# Patient Record
Sex: Male | Born: 1970 | Race: White | Hispanic: No | Marital: Single | Smoking: Current some day smoker
Health system: Southern US, Community
[De-identification: ages and names within clinical notes are randomized; demographics above are authoritative.]

## PROBLEM LIST (undated history)

## (undated) DIAGNOSIS — N200 Calculus of kidney: Secondary | ICD-10-CM

## (undated) DIAGNOSIS — T7840XA Allergy, unspecified, initial encounter: Secondary | ICD-10-CM

## (undated) HISTORY — DX: Allergy, unspecified, initial encounter: T78.40XA

---

## 2012-04-01 ENCOUNTER — Ambulatory Visit (INDEPENDENT_AMBULATORY_CARE_PROVIDER_SITE_OTHER): Payer: BC Managed Care – PPO | Admitting: Family Medicine

## 2012-04-01 VITALS — BP 120/84 | HR 87 | Temp 97.2°F | Resp 16 | Ht 70.2 in | Wt 185.0 lb

## 2012-04-01 DIAGNOSIS — Z720 Tobacco use: Secondary | ICD-10-CM

## 2012-04-01 DIAGNOSIS — J069 Acute upper respiratory infection, unspecified: Secondary | ICD-10-CM

## 2012-04-01 DIAGNOSIS — J40 Bronchitis, not specified as acute or chronic: Secondary | ICD-10-CM

## 2012-04-01 DIAGNOSIS — F17200 Nicotine dependence, unspecified, uncomplicated: Secondary | ICD-10-CM

## 2012-04-01 MED ORDER — HYDROCODONE-HOMATROPINE 5-1.5 MG/5ML PO SYRP: 5.0000 mL | ORAL_SOLUTION | Freq: Three times a day (TID) | ORAL | Status: DC | PRN

## 2012-04-01 MED ORDER — AZITHROMYCIN 250 MG PO TABS: ORAL_TABLET | ORAL | Status: DC

## 2012-04-01 MED ORDER — BENZONATATE 100 MG PO CAPS: ORAL_CAPSULE | ORAL | Status: DC

## 2012-04-01 NOTE — Progress Notes (Signed)
Subjective: 41 year old man with a history of having had a cold which is gone into his chest. He has a bad cough. The cough awakens him at night. He has used several over-the-counter cough and cold preparations without relief. He works in Insurance account manager at [employer]. He does smoke. He is divorced but lives with his parents currently. He has a 72-year-old child and cares for her on the weekend. No exposure to other major illness.  Objective: Alert oriented healthy-appearing man. His throat is clear. Neck supple without nodes. TMs normal. Chest clear to auscultation. Heart regular without murmur scalp slight redness.  Assessment: Bronchitis URI History of tobacco abuse  Plan: Urged to seriously consider get getting off the cigarettes. Drink plenty of fluids Z-Pak Cough syrup for nighttime use, and Tessalon for daytime use  Return if worse or not improving

## 2012-04-01 NOTE — Patient Instructions (Addendum)
Encourage fluids, rest and avoidance of cigarettes Tessalon for daytime use Hycodan syrup at night. Return if not improving

## 2013-01-07 ENCOUNTER — Encounter (HOSPITAL_COMMUNITY): Payer: Self-pay | Admitting: Emergency Medicine

## 2013-01-07 ENCOUNTER — Emergency Department (HOSPITAL_COMMUNITY): Payer: BC Managed Care – PPO

## 2013-01-07 ENCOUNTER — Emergency Department (HOSPITAL_COMMUNITY)
Admission: EM | Admit: 2013-01-07 | Discharge: 2013-01-07 | Disposition: A | Discharge status: Home / Self Care | Payer: BC Managed Care – PPO | Attending: Emergency Medicine | Admitting: Emergency Medicine

## 2013-01-07 DIAGNOSIS — R110 Nausea: Secondary | ICD-10-CM | POA: Insufficient documentation

## 2013-01-07 DIAGNOSIS — Z87442 Personal history of urinary calculi: Secondary | ICD-10-CM | POA: Insufficient documentation

## 2013-01-07 DIAGNOSIS — F17200 Nicotine dependence, unspecified, uncomplicated: Secondary | ICD-10-CM | POA: Insufficient documentation

## 2013-01-07 DIAGNOSIS — N201 Calculus of ureter: Secondary | ICD-10-CM

## 2013-01-07 LAB — COMPREHENSIVE METABOLIC PANEL
ALT: 26 U/L (ref 0–53)
Alkaline Phosphatase: 101 U/L (ref 39–117)
CO2: 27 mEq/L (ref 19–32)
Chloride: 100 mEq/L (ref 96–112)
GFR calc Af Amer: >90 mL/min (ref >90)
GFR calc non Af Amer: >90 mL/min (ref >90)
Glucose, Bld: 142 mg/dL — ABNORMAL HIGH (ref 70–99)
Potassium: 4.4 mEq/L (ref 3.5–5.1)
Sodium: 135 mEq/L (ref 135–145)
Total Bilirubin: 0.3 mg/dL (ref 0.3–1.2)

## 2013-01-07 LAB — URINALYSIS, ROUTINE W REFLEX MICROSCOPIC
Bilirubin Urine: NEGATIVE
Glucose, UA: NEGATIVE mg/dL
Protein, ur: 30 mg/dL — AB
Urobilinogen, UA: 0.2 mg/dL (ref 0.0–1.0)

## 2013-01-07 LAB — CBC WITH DIFFERENTIAL/PLATELET
Basophils Absolute: 0 10*3/uL (ref 0.0–0.1)
Basophils Relative: 0 % (ref 0–1)
MCHC: 35.8 g/dL (ref 30.0–36.0)
Monocytes Absolute: 0.7 10*3/uL (ref 0.1–1.0)
Neutro Abs: 13.6 10*3/uL — ABNORMAL HIGH (ref 1.7–7.7)
Neutrophils Relative %: 84 % — ABNORMAL HIGH (ref 43–77)
Platelets: 331 10*3/uL (ref 150–400)
RDW: 12.5 % (ref 11.5–15.5)
WBC: 16.2 10*3/uL — ABNORMAL HIGH (ref 4.0–10.5)

## 2013-01-07 MED ORDER — HYDROMORPHONE HCL PF 1 MG/ML IJ SOLN
1.0000 mg | Freq: Once | INTRAMUSCULAR | Status: AC
  Administered 2013-01-07: 1 mg via INTRAVENOUS
  Filled 2013-01-07: qty 1

## 2013-01-07 MED ORDER — SODIUM CHLORIDE 0.9 % IV SOLN
INTRAVENOUS | Status: DC
  Administered 2013-01-07: 16:00:00 via INTRAVENOUS

## 2013-01-07 MED ORDER — KETOROLAC TROMETHAMINE 30 MG/ML IJ SOLN
30.0000 mg | Freq: Once | INTRAMUSCULAR | Status: AC
  Administered 2013-01-07: 30 mg via INTRAVENOUS
  Filled 2013-01-07: qty 1

## 2013-01-07 MED ORDER — OXYCODONE-ACETAMINOPHEN 5-325 MG PO TABS: 1.0000 | ORAL_TABLET | Freq: Four times a day (QID) | ORAL | Status: DC | PRN

## 2013-01-07 MED ORDER — ONDANSETRON HCL 4 MG PO TABS: 4.0000 mg | ORAL_TABLET | Freq: Four times a day (QID) | ORAL | Status: DC

## 2013-01-07 MED ORDER — ONDANSETRON HCL 4 MG/2ML IJ SOLN
4.0000 mg | Freq: Once | INTRAMUSCULAR | Status: AC
  Administered 2013-01-07: 4 mg via INTRAVENOUS
  Filled 2013-01-07: qty 2

## 2013-01-07 NOTE — ED Provider Notes (Signed)
CSN: [id number]     Arrival date & time 01/07/13  1406 History     First MD Initiated Contact with Patient 01/07/13 1505     Chief Complaint  Patient presents with  . Flank Pain   (Consider location/radiation/quality/duration/timing/severity/associated sxs/prior Treatment) HPI Timothy Cowan is a 42 y.o.male without any significant PMH presents to the ER with complaints of right flank pain that started at noon today. It is sharp intermittent and a 10 out of 10 pain. The patient says he is unable to sit still. He has a history of kidney stones and says this feels like the same. He is nauseous but has not vomited. He denies having diarrhea, fevers, weakness or any other symptoms at this time. He has not had any noticeable blood in his urine or dysuria. He shouldn't is in distress due to severe pain.  Past Medical History  Diagnosis Date  . Kidney stone    History reviewed. No pertinent past surgical history. History reviewed. No pertinent family history. History  Substance Use Topics  . Smoking status: Current Every Day Smoker -- 0.50 packs/day for 5 years    Types: Cigarettes  . Smokeless tobacco: Not on file  . Alcohol Use: No    Review of Systems ROS is negative unless otherwise stated in the HPI Allergies  Review of patient's allergies indicates no known allergies.  Home Medications   Current Outpatient Rx  Name  Route  Sig  Dispense  Refill  . ibuprofen (ADVIL,MOTRIN) 200 MG tablet   Oral   Take 800 mg by mouth every 6 (six) hours as needed for pain.         Marland Kitchen ondansetron (ZOFRAN) 4 MG tablet   Oral   Take 1 tablet (4 mg total) by mouth every 6 (six) hours.   12 tablet   0   . oxyCODONE-acetaminophen (PERCOCET/ROXICET) 5-325 MG per tablet   Oral   Take 1-2 tablets by mouth every 6 (six) hours as needed for pain.   20 tablet   0    BP 125/76  Pulse 95  Temp(Src) 97.7 F (36.5 C) (Oral)  Resp 17  SpO2 96% Physical Exam  Nursing note and vitals  reviewed. Constitutional: He appears well-developed and well-nourished. No distress.  HENT:  Head: Normocephalic and atraumatic.  Eyes: Pupils are equal, round, and reactive to light.  Neck: Normal range of motion. Neck supple.  Cardiovascular: Normal rate and regular rhythm.   Pulmonary/Chest: Effort normal.  Abdominal: Soft. There is tenderness. There is CVA tenderness (right flank). There is no rigidity, no guarding, no tenderness at McBurney's point and negative Murphy's sign.  Neurological: He is alert.  Skin: Skin is warm and dry.     ED Course   Procedures (including critical care time)  Labs Reviewed  CBC WITH DIFFERENTIAL - Abnormal; Notable for the following:    WBC 16.2 (*)    Neutrophils Relative % 84 (*)    Neutro Abs 13.6 (*)    Lymphocytes Relative 11 (*)    All other components within normal limits  COMPREHENSIVE METABOLIC PANEL - Abnormal; Notable for the following:    Glucose, Bld 142 (*)    All other components within normal limits  URINALYSIS, ROUTINE W REFLEX MICROSCOPIC - Abnormal; Notable for the following:    APPearance CLOUDY (*)    Specific Gravity, Urine 1.031 (*)    Hgb urine dipstick LARGE (*)    Protein, ur 30 (*)    All other components  within normal limits  LIPASE, BLOOD  URINE MICROSCOPIC-ADD ON   Ct Abdomen Pelvis Wo Contrast  01/07/2013   *RADIOLOGY REPORT*  Clinical Data: Right-sided flank pain since new today.  History of kidney stones.  CT ABDOMEN AND PELVIS WITHOUT CONTRAST  Technique:  Multidetector CT imaging of the abdomen and pelvis was performed following the standard protocol without intravenous contrast.  Comparison: None.  Findings: There is mild to moderate right hydronephrosis with dilation of the proximal ureter to the level of a 3 mm stone. There is perinephric stranding.  The ureter below the stone is decompressed with no additional stones.  There are nonobstructing intrarenal stones on the right with at least one tiny  nonobstructing stones suggested in the upper pole of the left kidney.  There is a low density area along the posterior margin of the upper pole of the right kidney that is likely a cyst measuring approximate 2 cm.  The kidneys are otherwise unremarkable.  The left ureter is normal course and caliber with no stones or signs of obstruction.  The bladder decompressed but otherwise unremarkable.  Clear lung bases.  The heart is normal in size.  Normal liver, spleen, gallbladder and pancreas.  No bile duct dilation.  No adrenal masses.  No adenopathy.  There are no abnormal fluid collections.  The bowels unremarkable.  A normal appendix is visualized.  No significant bony abnormality.  IMPRESSION: 3 mm stone in the proximal right ureter causes mild to moderate right hydronephrosis.  Bilateral nonobstructing intrarenal stones.  Probable right renal cyst.  No other abnormalities.   Original Report Authenticated By: Amie Portland, M.D.   1. Right ureteral stone     MDM  3 mm stone in the right ureter. No compounding UTI. Pts pain under control IV analgesic. Will give referral to urology and prescription for pain and nausea medication.  42 y.o.Timothy Cowan's evaluation in the Emergency Department is complete. It has been determined that no acute conditions requiring further emergency intervention are present at this time. The patient/guardian have been advised of the diagnosis and plan. We have discussed signs and symptoms that warrant return to the ED, such as changes or worsening in symptoms.  Vital signs are stable at discharge. Filed Vitals:   01/07/13 1720  BP: 125/76  Pulse: 95  Temp: 97.7 F (36.5 C)  Resp: 17    Patient/guardian has voiced understanding and agreed to follow-up with the PCP or specialist.   Dorthula Matas, PA-C 01/07/13 1808

## 2013-01-07 NOTE — ED Provider Notes (Signed)
Medical screening examination/treatment/procedure(s) were performed by non-physician practitioner and as supervising physician I was immediately available for consultation/collaboration.   Junius Argyle, MD 01/07/13 [phone]

## 2013-01-07 NOTE — ED Notes (Signed)
Pt states that he has been having rt sided flank pain since noon.  Hx of kidney stones.

## 2013-02-02 ENCOUNTER — Emergency Department (HOSPITAL_COMMUNITY)
Admission: EM | Admit: 2013-02-02 | Discharge: 2013-02-02 | Disposition: A | Discharge status: Home / Self Care | Payer: BC Managed Care – PPO | Attending: Emergency Medicine | Admitting: Emergency Medicine

## 2013-02-02 ENCOUNTER — Encounter (HOSPITAL_COMMUNITY): Payer: Self-pay

## 2013-02-02 DIAGNOSIS — R110 Nausea: Secondary | ICD-10-CM | POA: Insufficient documentation

## 2013-02-02 DIAGNOSIS — F17200 Nicotine dependence, unspecified, uncomplicated: Secondary | ICD-10-CM | POA: Insufficient documentation

## 2013-02-02 DIAGNOSIS — N200 Calculus of kidney: Secondary | ICD-10-CM

## 2013-02-02 LAB — COMPREHENSIVE METABOLIC PANEL
ALT: 42 U/L (ref 0–53)
AST: 28 U/L (ref 0–37)
Alkaline Phosphatase: 101 U/L (ref 39–117)
CO2: 25 mEq/L (ref 19–32)
Calcium: 10.6 mg/dL — ABNORMAL HIGH (ref 8.4–10.5)
GFR calc Af Amer: >90 mL/min (ref >90)
Glucose, Bld: 112 mg/dL — ABNORMAL HIGH (ref 70–99)
Potassium: 4.2 mEq/L (ref 3.5–5.1)
Sodium: 136 mEq/L (ref 135–145)
Total Protein: 8.1 g/dL (ref 6.0–8.3)

## 2013-02-02 LAB — URINALYSIS, ROUTINE W REFLEX MICROSCOPIC
Glucose, UA: NEGATIVE mg/dL
Specific Gravity, Urine: 1.026 (ref 1.005–1.030)
Urobilinogen, UA: 0.2 mg/dL (ref 0.0–1.0)

## 2013-02-02 LAB — CBC WITH DIFFERENTIAL/PLATELET
Basophils Absolute: 0 10*3/uL (ref 0.0–0.1)
Basophils Relative: 0 % (ref 0–1)
Eosinophils Absolute: 0.2 10*3/uL (ref 0.0–0.7)
Eosinophils Relative: 1 % (ref 0–5)
HCT: 43.9 % (ref 39.0–52.0)
Hemoglobin: 15.9 g/dL (ref 13.0–17.0)
MCH: 31.9 pg (ref 26.0–34.0)
MCHC: 36.2 g/dL — ABNORMAL HIGH (ref 30.0–36.0)
MCV: 88.2 fL (ref 78.0–100.0)
Monocytes Absolute: 0.8 10*3/uL (ref 0.1–1.0)
Monocytes Relative: 6 % (ref 3–12)
Neutro Abs: 10.8 10*3/uL — ABNORMAL HIGH (ref 1.7–7.7)
RDW: 12.2 % (ref 11.5–15.5)

## 2013-02-02 LAB — URINE MICROSCOPIC-ADD ON

## 2013-02-02 MED ORDER — OXYCODONE-ACETAMINOPHEN 10-325 MG PO TABS: 1.0000 | ORAL_TABLET | ORAL | Status: DC | PRN

## 2013-02-02 MED ORDER — MORPHINE SULFATE 4 MG/ML IJ SOLN
4.0000 mg | INTRAMUSCULAR | Status: DC | PRN
  Filled 2013-02-02: qty 1

## 2013-02-02 MED ORDER — ONDANSETRON 4 MG PO TBDP: 4.0000 mg | ORAL_TABLET | Freq: Three times a day (TID) | ORAL | Status: DC | PRN

## 2013-02-02 MED ORDER — SODIUM CHLORIDE 0.9 % IV SOLN
Freq: Once | INTRAVENOUS | Status: AC
  Administered 2013-02-02: 08:00:00 via INTRAVENOUS

## 2013-02-02 MED ORDER — MORPHINE SULFATE 4 MG/ML IJ SOLN: 4.0000 mg | Freq: Once | INTRAMUSCULAR | Status: DC

## 2013-02-02 MED ORDER — ONDANSETRON HCL 4 MG/2ML IJ SOLN
4.0000 mg | Freq: Once | INTRAMUSCULAR | Status: AC
  Administered 2013-02-02: 4 mg via INTRAVENOUS
  Filled 2013-02-02: qty 2

## 2013-02-02 MED ORDER — KETOROLAC TROMETHAMINE 30 MG/ML IJ SOLN
30.0000 mg | Freq: Once | INTRAMUSCULAR | Status: AC
  Administered 2013-02-02: 30 mg via INTRAVENOUS
  Filled 2013-02-02: qty 1

## 2013-02-02 MED ORDER — MORPHINE SULFATE 4 MG/ML IJ SOLN
8.0000 mg | Freq: Once | INTRAMUSCULAR | Status: AC
  Administered 2013-02-02: 8 mg via INTRAVENOUS
  Filled 2013-02-02: qty 2

## 2013-02-02 NOTE — ED Notes (Signed)
Pt given urinal, aware sample is needed

## 2013-02-02 NOTE — ED Provider Notes (Signed)
CSN: [id number]     Arrival date & time 02/02/13  5621 History   First MD Initiated Contact with Patient 02/02/13 [phone]     Chief Complaint  Patient presents with  . Flank Pain   Patient is a 42 y.o. male presenting with flank pain.  Flank Pain Associated symptoms include nausea. Pertinent negatives include no abdominal pain, arthralgias, chest pain, chills, diaphoresis, fatigue, fever, myalgias or vomiting.   Timothy Cowan is a 42 y/o M w/ PMHx of recurrent nephrolithiasis who presents to the ED with worsening R flank pain that began around 4 AM.  Pain is intermittent, 10/10, sharp stabbing pain.  Denies any fever, chills, sweats, diarrhea, constipation, vomiting.   Denies hematuria, dysuria, increased urgency/frequency but has had some nausea this AM as well.    Past Medical History  Diagnosis Date  . Kidney stone    History reviewed. No pertinent past surgical history. History reviewed. No pertinent family history. History  Substance Use Topics  . Smoking status: Current Every Day Smoker -- 0.50 packs/day for 5 years    Types: Cigarettes  . Smokeless tobacco: Not on file  . Alcohol Use: Yes     Comment: rarely    Review of Systems  Constitutional: Negative for fever, chills, diaphoresis and fatigue.  Respiratory: Negative for chest tightness.   Cardiovascular: Negative for chest pain.  Gastrointestinal: Positive for nausea. Negative for vomiting, abdominal pain and blood in stool.  Genitourinary: Positive for flank pain. Negative for dysuria, urgency, frequency, hematuria, scrotal swelling and testicular pain.  Musculoskeletal: Positive for back pain. Negative for myalgias and arthralgias.  All other systems reviewed and are negative.    Allergies  Review of patient's allergies indicates no known allergies.  Home Medications   Current Outpatient Rx  Name  Route  Sig  Dispense  Refill  . ibuprofen (ADVIL,MOTRIN) 200 MG tablet   Oral   Take 800 mg by mouth every 6 (six)  hours as needed for pain.         Marland Kitchen ondansetron (ZOFRAN) 4 MG tablet   Oral   Take 1 tablet (4 mg total) by mouth every 6 (six) hours.   12 tablet   0   . oxyCODONE-acetaminophen (PERCOCET/ROXICET) 5-325 MG per tablet   Oral   Take 1-2 tablets by mouth every 6 (six) hours as needed for pain.   20 tablet   0    BP 148/99  Pulse 100  Temp(Src) 97.4 F (36.3 C) (Oral)  Resp 18  SpO2 93% Physical Exam  Constitutional: He is oriented to person, place, and time. He appears well-developed and well-nourished. He is active.  Non-toxic appearance. He does not have a sickly appearance. He does not appear ill. He appears distressed.  HENT:  Head: Normocephalic and atraumatic.  Eyes: Conjunctivae and EOM are normal.  Cardiovascular: Normal rate, regular rhythm, intact distal pulses and normal pulses.   No murmur heard. Pulmonary/Chest: Effort normal and breath sounds normal.  Abdominal: Normal appearance and bowel sounds are normal. There is no hepatosplenomegaly. There is no tenderness. There is CVA tenderness (R flank). There is no rigidity, no rebound and no guarding.  Neurological: He is alert and oriented to person, place, and time. He has normal strength.  Skin: Skin is warm and dry. He is not diaphoretic.  Psychiatric: He has a normal mood and affect. His speech is normal.    ED Course  Procedures (including critical care time) Labs Review Labs Reviewed  URINALYSIS, ROUTINE  W REFLEX MICROSCOPIC - Abnormal; Notable for the following:    Hgb urine dipstick LARGE (*)    Leukocytes, UA SMALL (*)    All other components within normal limits  COMPREHENSIVE METABOLIC PANEL - Abnormal; Notable for the following:    Glucose, Bld 112 (*)    Calcium 10.6 (*)    Total Bilirubin 0.2 (*)    GFR calc non Af Amer 85 (*)    All other components within normal limits  CBC WITH DIFFERENTIAL - Abnormal; Notable for the following:    WBC 14.3 (*)    MCHC 36.2 (*)    Neutro Abs 10.8 (*)     All other components within normal limits  URINE MICROSCOPIC-ADD ON - Abnormal; Notable for the following:    Casts HYALINE CASTS (*)    Crystals CA OXALATE CRYSTALS (*)    All other components within normal limits   Imaging Review No results found.  MDM  No diagnosis found. Pt with recurrent nephrolithiasis in the past, c/o similar Sx.  Will get UA, CMP, CBC to evaluate for ongoing processes as well as morphine, zofran, and fluids.  May need repeat imaging and consider toradol pending kidney fxn.  Will need f/u with urology as he has not gone since last time as well.  8:42 AM - Pt feeling much better after a dose of morphine as well as zofran.   UA showing large amount of blood along with CA oxalate crystals.  CBC showing slight leukocytosis but improvement from 4 weeks ago and no left shift.  Will send home with limited percocet, zofran, and have him f/u with urology w/in the next couple of weeks.    Twana First Paulina Fusi, DO of [facility] 02/02/2013, 8:57 AM       Timothy Deutscher, DO 02/02/13 [phone]

## 2013-02-02 NOTE — ED Provider Notes (Signed)
I saw and evaluated the patient, reviewed the resident's note and I agree with the findings and plan.  Pain improved at this time. Dc home. No indication for imaging. Urology follow up. Symptoms consistent with ureteral colic. Pt with additional right renal stones which may be on the move  Lyanne Co, MD 02/02/13 0900

## 2013-02-02 NOTE — ED Notes (Signed)
Per pt, sudden onset rt flank pain starting at 4 am.  Pt drove self to hospital.  Nausea no vomiting.  Pt states hx of kidney stones and this feels the same.

## 2013-02-28 ENCOUNTER — Encounter (HOSPITAL_COMMUNITY): Payer: Self-pay | Admitting: Emergency Medicine

## 2013-02-28 ENCOUNTER — Emergency Department (HOSPITAL_COMMUNITY)
Admission: EM | Admit: 2013-02-28 | Discharge: 2013-02-28 | Disposition: A | Discharge status: Home / Self Care | Payer: BC Managed Care – PPO | Attending: Emergency Medicine | Admitting: Emergency Medicine

## 2013-02-28 DIAGNOSIS — N23 Unspecified renal colic: Secondary | ICD-10-CM

## 2013-02-28 DIAGNOSIS — R110 Nausea: Secondary | ICD-10-CM | POA: Insufficient documentation

## 2013-02-28 DIAGNOSIS — F17200 Nicotine dependence, unspecified, uncomplicated: Secondary | ICD-10-CM | POA: Insufficient documentation

## 2013-02-28 LAB — BASIC METABOLIC PANEL
BUN: 16 mg/dL (ref 6–23)
Creatinine, Ser: 1.04 mg/dL (ref 0.50–1.35)
GFR calc Af Amer: >90 mL/min (ref >90)
GFR calc non Af Amer: 88 mL/min — ABNORMAL LOW (ref >90)
Potassium: 3.7 mEq/L (ref 3.5–5.1)
Sodium: 135 mEq/L (ref 135–145)

## 2013-02-28 LAB — URINALYSIS, ROUTINE W REFLEX MICROSCOPIC
Glucose, UA: NEGATIVE mg/dL
Leukocytes, UA: NEGATIVE
Protein, ur: 30 mg/dL — AB
Specific Gravity, Urine: 1.029 (ref 1.005–1.030)
pH: 6 (ref 5.0–8.0)

## 2013-02-28 MED ORDER — KETOROLAC TROMETHAMINE 30 MG/ML IJ SOLN
30.0000 mg | Freq: Once | INTRAMUSCULAR | Status: AC
  Administered 2013-02-28: 30 mg via INTRAVENOUS
  Filled 2013-02-28: qty 1

## 2013-02-28 MED ORDER — OXYCODONE-ACETAMINOPHEN 10-325 MG PO TABS: 1.0000 | ORAL_TABLET | ORAL | Status: DC | PRN

## 2013-02-28 MED ORDER — ONDANSETRON 4 MG PO TBDP: 4.0000 mg | ORAL_TABLET | Freq: Three times a day (TID) | ORAL | Status: DC | PRN

## 2013-02-28 MED ORDER — HYDROMORPHONE HCL PF 1 MG/ML IJ SOLN
1.0000 mg | Freq: Once | INTRAMUSCULAR | Status: AC
  Administered 2013-02-28: 1 mg via INTRAVENOUS
  Filled 2013-02-28: qty 1

## 2013-02-28 MED ORDER — HYDROMORPHONE HCL PF 1 MG/ML IJ SOLN: 1.0000 mg | Freq: Once | INTRAMUSCULAR | Status: DC

## 2013-02-28 MED ORDER — IBUPROFEN 200 MG PO TABS: 800.0000 mg | ORAL_TABLET | Freq: Four times a day (QID) | ORAL | Status: DC | PRN

## 2013-02-28 NOTE — ED Notes (Signed)
Patient is alert and oriented x3.  He was given DC instructions and follow up visit instructions.  Patient gave verbal understanding.  He was DC ambulatory under his own power to home.  V/S stable.  He was not showing any signs of distress on DC 

## 2013-02-28 NOTE — ED Provider Notes (Signed)
CSN: [id number]     Arrival date & time 02/28/13  0110 History   First MD Initiated Contact with Patient 02/28/13 0133     Chief Complaint  Patient presents with  . Flank Pain   (Consider location/radiation/quality/duration/timing/severity/associated sxs/prior Treatment) HPI 42 year old male presents to emergency room with complaint of right flank pain.  Patient reports pain started around 6 PM tonight.  He has had history of kidney stones.  The past, and pain is similar to prior episodes.  Pain started on right flank and has radiated down into his right groin.  Patient has known remaining stones about 1 cm each.  Last stone was about a month ago.  He denies any vomiting, but has had nausea.  No fevers no chills.  Patient took Zofran and Percocet at home without improvement in symptoms. Past Medical History  Diagnosis Date  . Kidney stone    History reviewed. No pertinent past surgical history. History reviewed. No pertinent family history. History  Substance Use Topics  . Smoking status: Current Every Day Smoker -- 0.50 packs/day for 5 years    Types: Cigarettes  . Smokeless tobacco: Not on file  . Alcohol Use: Yes     Comment: rarely    Review of Systems  See History of Present Illness; otherwise all other systems are reviewed and negative  Allergies  Review of patient's allergies indicates no known allergies.  Home Medications   Current Outpatient Rx  Name  Route  Sig  Dispense  Refill  . ibuprofen (ADVIL,MOTRIN) 200 MG tablet   Oral   Take 4 tablets (800 mg total) by mouth every 6 (six) hours as needed for pain.   30 tablet   0   . ondansetron (ZOFRAN ODT) 4 MG disintegrating tablet   Oral   Take 1 tablet (4 mg total) by mouth every 8 (eight) hours as needed for nausea.   20 tablet   0   . oxyCODONE-acetaminophen (PERCOCET) 10-325 MG per tablet   Oral   Take 1 tablet by mouth every 4 (four) hours as needed for pain.   30 tablet   0    BP 141/98  Pulse 88   Temp(Src) 98.7 F (37.1 C) (Oral)  Resp 20  Ht 5\' 9"  (1.753 m)  Wt 177 lb (80.287 kg)  BMI 26.13 kg/m2  SpO2 98% Physical Exam  Constitutional: He is oriented to person, place, and time. He appears well-developed and well-nourished. He appears distressed (uncomfortable appearing).  HENT:  Head: Normocephalic and atraumatic.  Nose: Nose normal.  Mouth/Throat: Oropharynx is clear and moist.  Eyes: Conjunctivae and EOM are normal. Pupils are equal, round, and reactive to light.  Neck: Normal range of motion. Neck supple. No JVD present. No tracheal deviation present. No thyromegaly present.  Cardiovascular: Normal rate, regular rhythm, normal heart sounds and intact distal pulses.  Exam reveals no gallop and no friction rub.   No murmur heard. Pulmonary/Chest: Effort normal and breath sounds normal. No stridor. No respiratory distress. He has no wheezes. He has no rales. He exhibits no tenderness.  Abdominal: Soft. Bowel sounds are normal. He exhibits no distension and no mass. There is no tenderness. There is no rebound and no guarding.  Right CVA tenderness  Musculoskeletal: Normal range of motion. He exhibits no edema and no tenderness.  Lymphadenopathy:    He has no cervical adenopathy.  Neurological: He is alert and oriented to person, place, and time. He exhibits normal muscle tone. Coordination normal.  Skin: Skin is warm and dry. No rash noted. No erythema. No pallor.  Psychiatric: He has a normal mood and affect. His behavior is normal. Judgment and thought content normal.    ED Course  Procedures (including critical care time) Labs Review Labs Reviewed  URINALYSIS, ROUTINE W REFLEX MICROSCOPIC - Abnormal; Notable for the following:    APPearance CLOUDY (*)    Hgb urine dipstick LARGE (*)    Protein, ur 30 (*)    All other components within normal limits  BASIC METABOLIC PANEL - Abnormal; Notable for the following:    Glucose, Bld 112 (*)    GFR calc non Af Amer 88 (*)     All other components within normal limits  URINE MICROSCOPIC-ADD ON   Imaging Review No results found.  EKG Interpretation   None       MDM   1. Renal colic on right side    42 year old male with right flank pain.  He has hematuria.  Clinically he has renal colic.  Will hold off on CT scans, as patient has known stones.  Patient has not yet followed up with urology, and has been strongly encouraged to followup with them.  Patient is feeling better after pain medication.  Will discharge to home.    Olivia Mackie, MD 02/28/13 [phone]

## 2013-02-28 NOTE — ED Notes (Signed)
Pt complains of flank pain and right leg pain

## 2014-03-03 ENCOUNTER — Ambulatory Visit (INDEPENDENT_AMBULATORY_CARE_PROVIDER_SITE_OTHER): Payer: BC Managed Care – PPO | Admitting: Family Medicine

## 2014-03-03 ENCOUNTER — Ambulatory Visit (INDEPENDENT_AMBULATORY_CARE_PROVIDER_SITE_OTHER): Payer: BC Managed Care – PPO

## 2014-03-03 VITALS — BP 110/70 | HR 118 | Temp 97.9°F | Resp 16 | Ht 68.5 in | Wt 183.0 lb

## 2014-03-03 DIAGNOSIS — J9801 Acute bronchospasm: Secondary | ICD-10-CM

## 2014-03-03 DIAGNOSIS — R05 Cough: Secondary | ICD-10-CM

## 2014-03-03 DIAGNOSIS — J029 Acute pharyngitis, unspecified: Secondary | ICD-10-CM

## 2014-03-03 DIAGNOSIS — R059 Cough, unspecified: Secondary | ICD-10-CM

## 2014-03-03 DIAGNOSIS — J069 Acute upper respiratory infection, unspecified: Secondary | ICD-10-CM

## 2014-03-03 LAB — POCT INFLUENZA A/B
Influenza A, POC: NEGATIVE
Influenza B, POC: NEGATIVE

## 2014-03-03 LAB — POCT RAPID STREP A (OFFICE): Rapid Strep A Screen: NEGATIVE

## 2014-03-03 MED ORDER — IPRATROPIUM BROMIDE 0.03 % NA SOLN: 2.0000 | Freq: Two times a day (BID) | NASAL | Status: DC

## 2014-03-03 MED ORDER — HYDROCOD POLST-CHLORPHEN POLST 10-8 MG/5ML PO LQCR: 5.0000 mL | Freq: Two times a day (BID) | ORAL | Status: DC | PRN

## 2014-03-03 MED ORDER — ALBUTEROL SULFATE (2.5 MG/3ML) 0.083% IN NEBU
2.5000 mg | INHALATION_SOLUTION | Freq: Once | RESPIRATORY_TRACT | Status: AC
  Administered 2014-03-03: 2.5 mg via RESPIRATORY_TRACT

## 2014-03-03 NOTE — Progress Notes (Addendum)
Subjective:    Patient ID: Timothy Cowan, male    DOB: [birth year], 43 y.o.   MRN: [id number]  This chart was scribed for Ethelda ChickKristi M Knox Holdman, MD by Tonye RoyaltyJoshua Chen, ED Scribe. This patient was seen in room 11 and the patient's care was started at 9:46 AM.   03/03/2014  Cough  HPI HPI Comments: Timothy Cowan is a 43 y.o. male who presents to the [facility] complaining ofcough with onset 3 days ago. He reports associated chills, diaphoresis, headache, sore throat, and subjective fever. He states that last night, he was coughing so bad that he vomited and had difficulty sleeping. He states his cough is dry and produces a clear mucous. He states his daughter was sick last week and was evaluated here, but is recovered now. He states he has had a flu shot this year. He states he has been using Mucinex. He states he recently starting smoking again after quitting last year. He denies significant chronic medical problems. 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 He denies ear pain, nausea, vomiting separate from coughing, diarrhea, or shortness of breath.  Review of Systems  Constitutional: Positive for fever, chills, diaphoresis and fatigue.  HENT: Positive for congestion, rhinorrhea, sore throat and trouble swallowing. Negative for ear pain and voice change.   Respiratory: Positive for cough. Negative for shortness of breath and wheezing.   Gastrointestinal: Positive for vomiting. Negative for nausea, abdominal pain and diarrhea.  Skin: Negative for rash.  Neurological: Positive for headaches.    Past Medical History  Diagnosis Date  . Allergy    History reviewed. No pertinent past surgical history. No Known Allergies Current Outpatient Prescriptions  Medication Sig Dispense Refill  . loratadine-pseudoephedrine (CLARITIN-D 24-HOUR) 10-240 MG per 24 hr tablet Take 1 tablet by mouth daily.      . chlorpheniramine-HYDROcodone (TUSSIONEX) 10-8 MG/5ML LQCR Take 5 mLs by mouth every 12  (twelve) hours as needed for cough.  180 mL  0  . ipratropium (ATROVENT) 0.03 % nasal spray Place 2 sprays into the nose 2 (two) times daily.  30 mL  0  . oxyCODONE-acetaminophen (PERCOCET) 10-325 MG per tablet Take 1 tablet by mouth every 4 (four) hours as needed for pain.  30 tablet  0   No current facility-administered medications for this visit.       Objective:    BP 110/70  Pulse 118  Temp(Src) 97.9 F (36.6 C) (Oral)  Resp 16  Ht 5' 8.5" (1.74 m)  Wt 183 lb (83.008 kg)  BMI 27.42 kg/m2  SpO2 97% Physical Exam  Nursing note and vitals reviewed. Constitutional: He is oriented to person, place, and time. He appears well-developed and well-nourished. No distress.  Appears ill  HENT:  Head: Normocephalic and atraumatic.  Right Ear: External ear normal.  Left Ear: External ear normal.  Mouth/Throat: No oropharyngeal exudate.  Erythema to oropharynx  Eyes: Conjunctivae and EOM are normal. Pupils are equal, round, and reactive to light.  Neck: Normal range of motion. Neck supple.  Cardiovascular: Normal rate, regular rhythm and normal heart sounds.   No murmur heard. Pulmonary/Chest: Effort normal. No respiratory distress. He has wheezes (mild left-sided wheezing). He has no rales.  Musculoskeletal: Normal range of motion.  Lymphadenopathy:    He has no cervical adenopathy.  Neurological: He is alert and oriented to person, place, and time.  Skin: Skin is warm and dry. He is not diaphoretic.  Psychiatric: He has a normal mood and  affect.   Results for orders placed in visit on 03/03/14  POCT INFLUENZA A/B      Result Value Ref Range   Influenza A, POC Negative     Influenza B, POC Negative    POCT RAPID STREP A (OFFICE)      Result Value Ref Range   Rapid Strep A Screen Negative  Negative   [facility] reading (PRIMARY) by  Dr. Katrinka BlazingSmith.  CXR: NAD  ALBUTEROL NEBULIZER ADMINISTERED.    Assessment & Plan:   1. Cough   2. Sore throat   3. Bronchospasm   4. Acute upper  respiratory infection     1. Viral URI:  New. Rx for Atrovent nasal spray, Tussionex provided.  Recommend continuing Mucinex; recommend Tylenol or Ibuprofen PRN fever.  OOW note for tomorrow provided. 2.  Bronchospasm:  New.  S/p Albuterol nebulizer in office.   Meds ordered this encounter  Medications  . loratadine-pseudoephedrine (CLARITIN-D 24-HOUR) 10-240 MG per 24 hr tablet    Sig: Take 1 tablet by mouth daily.  Marland Kitchen. ipratropium (ATROVENT) 0.03 % nasal spray    Sig: Place 2 sprays into the nose 2 (two) times daily.    Dispense:  30 mL    Refill:  0  . chlorpheniramine-HYDROcodone (TUSSIONEX) 10-8 MG/5ML LQCR    Sig: Take 5 mLs by mouth every 12 (twelve) hours as needed for cough.    Dispense:  180 mL    Refill:  0  . albuterol (PROVENTIL) (2.5 MG/3ML) 0.083% nebulizer solution 2.5 mg    Sig:     Return if symptoms worsen or fail to improve.  I personally performed the services described in this documentation, which was scribed in my presence.  The recorded information has been reviewed and is accurate.  Nilda SimmerKristi Lonnel Gjerde, M.D.  [facility]  [place] [address] [place], [place]  [id number] [phone] phone [phone] fax

## 2014-03-03 NOTE — Patient Instructions (Signed)
Upper Respiratory Infection, Adult An upper respiratory infection (URI) is also sometimes known as the common cold. The upper respiratory tract includes the nose, sinuses, throat, trachea, and bronchi. Bronchi are the airways leading to the lungs. Most people improve within 1 week, but symptoms can last up to 2 weeks. A residual cough may last even longer.  CAUSES Many different viruses can infect the tissues lining the upper respiratory tract. The tissues become irritated and inflamed and often become very moist. Mucus production is also common. A cold is contagious. You can easily spread the virus to others by oral contact. This includes kissing, sharing a glass, coughing, or sneezing. Touching your mouth or nose and then touching a surface, which is then touched by another person, can also spread the virus. SYMPTOMS  Symptoms typically develop 1 to 3 days after you come in contact with a cold virus. Symptoms vary from person to person. They may include:  Runny nose.  Sneezing.  Nasal congestion.  Sinus irritation.  Sore throat.  Loss of voice (laryngitis).  Cough.  Fatigue.  Muscle aches.  Loss of appetite.  Headache.  Low-grade fever. DIAGNOSIS  You might diagnose your own cold based on familiar symptoms, since most people get a cold 2 to 3 times a year. Your caregiver can confirm this based on your exam. Most importantly, your caregiver can check that your symptoms are not due to another disease such as strep throat, sinusitis, pneumonia, asthma, or epiglottitis. Blood tests, throat tests, and X-rays are not necessary to diagnose a common cold, but they may sometimes be helpful in excluding other more serious diseases. Your caregiver will decide if any further tests are required. RISKS AND COMPLICATIONS  You may be at risk for a more severe case of the common cold if you smoke cigarettes, have chronic heart disease (such as heart failure) or lung disease (such as asthma), or if  you have a weakened immune system. The very young and very old are also at risk for more serious infections. Bacterial sinusitis, middle ear infections, and bacterial pneumonia can complicate the common cold. The common cold can worsen asthma and chronic obstructive pulmonary disease (COPD). Sometimes, these complications can require emergency medical care and may be life-threatening. PREVENTION  The best way to protect against getting a cold is to practice good hygiene. Avoid oral or hand contact with people with cold symptoms. Wash your hands often if contact occurs. There is no clear evidence that vitamin C, vitamin E, echinacea, or exercise reduces the chance of developing a cold. However, it is always recommended to get plenty of rest and practice good nutrition. TREATMENT  Treatment is directed at relieving symptoms. There is no cure. Antibiotics are not effective, because the infection is caused by a virus, not by bacteria. Treatment may include:  Increased fluid intake. Sports drinks offer valuable electrolytes, sugars, and fluids.  Breathing heated mist or steam (vaporizer or shower).  Eating chicken soup or other clear broths, and maintaining good nutrition.  Getting plenty of rest.  Using gargles or lozenges for comfort.  Controlling fevers with ibuprofen or acetaminophen as directed by your caregiver.  Increasing usage of your inhaler if you have asthma. Zinc gel and zinc lozenges, taken in the first 24 hours of the common cold, can shorten the duration and lessen the severity of symptoms. Pain medicines may help with fever, muscle aches, and throat pain. A variety of non-prescription medicines are available to treat congestion and runny nose. Your caregiver   can make recommendations and may suggest nasal or lung inhalers for other symptoms.  HOME CARE INSTRUCTIONS   Only take over-the-counter or prescription medicines for pain, discomfort, or fever as directed by your  caregiver.  Use a warm mist humidifier or inhale steam from a shower to increase air moisture. This may keep secretions moist and make it easier to breathe.  Drink enough water and fluids to keep your urine clear or pale yellow.  Rest as needed.  Return to work when your temperature has returned to normal or as your caregiver advises. You may need to stay home longer to avoid infecting others. You can also use a face mask and careful hand washing to prevent spread of the virus. SEEK MEDICAL CARE IF:   After the first few days, you feel you are getting worse rather than better.  You need your caregiver's advice about medicines to control symptoms.  You develop chills, worsening shortness of breath, or brown or red sputum. These may be signs of pneumonia.  You develop yellow or brown nasal discharge or pain in the face, especially when you bend forward. These may be signs of sinusitis.  You develop a fever, swollen neck glands, pain with swallowing, or white areas in the back of your throat. These may be signs of strep throat. SEEK IMMEDIATE MEDICAL CARE IF:   You have a fever.  You develop severe or persistent headache, ear pain, sinus pain, or chest pain.  You develop wheezing, a prolonged cough, cough up blood, or have a change in your usual mucus (if you have chronic lung disease).  You develop sore muscles or a stiff neck. Document Released: 10/27/2000 Document Revised: 07/26/2011 Document Reviewed: 08/08/2013 [employer] Patient Information 2015 [employer]. This information is not intended to replace advice given to you by your health care provider. Make sure you discuss any questions you have with your health care provider.  

## 2014-03-05 LAB — CULTURE, GROUP A STREP: Organism ID, Bacteria: NORMAL

## 2014-03-07 ENCOUNTER — Ambulatory Visit (INDEPENDENT_AMBULATORY_CARE_PROVIDER_SITE_OTHER): Payer: BC Managed Care – PPO | Admitting: Family Medicine

## 2014-03-07 VITALS — BP 122/87 | HR 116 | Temp 97.8°F | Resp 20 | Ht 69.0 in | Wt 185.0 lb

## 2014-03-07 DIAGNOSIS — J209 Acute bronchitis, unspecified: Secondary | ICD-10-CM

## 2014-03-07 MED ORDER — LEVOFLOXACIN 500 MG PO TABS: 500.0000 mg | ORAL_TABLET | Freq: Every day | ORAL | Status: DC

## 2014-03-07 NOTE — Patient Instructions (Signed)

## 2014-03-07 NOTE — Progress Notes (Signed)
This chart was scribed for Timothy SidleKurt Cassidee Deats, MD by Tonye RoyaltyJoshua Chen, ED Scribe. This patient was seen in room 10 and the patient's care was started at 12:15 PM.   Patient ID: Timothy Cowan MRN: [id number], DOB: December 16, 1970, 43 y.o. Date of Encounter: 03/07/2014, 12:15 PM  Primary Physician: No PCP Per Patient  Chief Complaint: cough  HPI: 43 y.o. year old male with history below presents for follow; he was evaluated here for cough 4 days ago and states he has not improved. He states he has had cough, fever, chills, and rhinorrhea with onset 7 days ago. He states his cough has been productive. He states he had a chest x-ray which records indicate revealed NAD, strep test that came back positive, and flu test when he was here last. He states his daughter was sick a few weeks ago. He reports smoking.  [place] 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  Past Medical History  Diagnosis Date  . Allergy      Home Meds: Prior to Admission medications   Medication Sig Start Date End Date Taking? Authorizing Provider  chlorpheniramine-HYDROcodone (TUSSIONEX) 10-8 MG/5ML LQCR Take 5 mLs by mouth every 12 (twelve) hours as needed for cough. 03/03/14  Yes Ethelda ChickKristi M Smith, MD  ipratropium (ATROVENT) 0.03 % nasal spray Place 2 sprays into the nose 2 (two) times daily. 03/03/14  Yes Ethelda ChickKristi M Smith, MD  loratadine-pseudoephedrine (CLARITIN-D 24-HOUR) 10-240 MG per 24 hr tablet Take 1 tablet by mouth daily.   Yes Historical Provider, MD    Allergies: No Known Allergies  History   Social History  . Marital Status: Single    Spouse Name: N/A    Number of Children: N/A  . Years of Education: N/A   Occupational History  . Not on file.   Social History Main Topics  . Smoking status: Current Every Day Smoker -- 0.50 packs/day for 5 years    Types: Cigarettes  . Smokeless tobacco: Not on file  . Alcohol Use: Yes     Comment: rarely  . Drug Use: No  . Sexual Activity: Yes   Other  Topics Concern  . Not on file   Social History Narrative  . No narrative on file     Review of Systems: Constitutional: negative for night sweats, weight changes, or fatigue. Positive for fever and chills HEENT: negative for vision changes, hearing loss, congestion, ST, epistaxis, or sinus pressure. Positive for rhinorrhea Cardiovascular: negative for chest pain or palpitations Respiratory: negative for hemoptysis, wheezing, shortness of breath. Positive for cough Abdominal: negative for abdominal pain, nausea, vomiting, diarrhea, or constipation Dermatological: negative for rash Neurologic: negative for headache, dizziness, or syncope All other systems reviewed and are otherwise negative with the exception to those above and in the HPI.   Physical Exam: Blood pressure 122/87, pulse 116, temperature 97.8 F (36.6 C), temperature source Oral, resp. rate 20, height 5\' 9"  (1.753 m), weight 185 lb (83.915 kg), SpO2 95.00%., Body mass index is 27.31 kg/(m^2). General: Well developed, well nourished, in no acute distress. Head: Normocephalic, atraumatic, eyes without discharge, sclera non-icteric, nares are without discharge. Bilateral auditory canals clear, TM's are without perforation, pearly grey and translucent with reflective cone of light bilaterally. Oral cavity moist, posterior pharynx without exudate, erythema, peritonsillar abscess, or post nasal drip.  Neck: Supple. No thyromegaly. Full ROM. No lymphadenopathy. Lungs: Wheezes bilaterally to auscultation without rales, or rhonchi. Breathing is unlabored. Heart: RRR with S1 S2. No  murmurs, rubs, or gallops appreciated. Abdomen: Soft, non-tender, non-distended with normoactive bowel sounds. No hepatomegaly. No rebound/guarding. No obvious abdominal masses. Msk:  Strength and tone normal for age. Extremities/Skin: Warm and dry. No clubbing or cyanosis. No edema. No rashes or suspicious lesions. Neuro: Alert and oriented X 3. Moves all  extremities spontaneously. Gait is normal. CNII-XII grossly in tact. Psych:  Responds to questions appropriately with a normal affect.     ASSESSMENT AND PLAN:  43 y.o. year old male with Acute bronchitis, unspecified organism - Plan: levofloxacin (LEVAQUIN) 500 MG tablet   Signed, Timothy SidleKurt Alto Gandolfo, MD 03/07/2014  12:13 PM

## 2014-05-22 ENCOUNTER — Ambulatory Visit (INDEPENDENT_AMBULATORY_CARE_PROVIDER_SITE_OTHER): Payer: BLUE CROSS/BLUE SHIELD | Admitting: Family Medicine

## 2014-05-22 VITALS — BP 120/80 | HR 117 | Temp 97.6°F | Resp 16 | Ht 70.0 in | Wt 187.0 lb

## 2014-05-22 DIAGNOSIS — G2581 Restless legs syndrome: Secondary | ICD-10-CM

## 2014-05-22 DIAGNOSIS — R202 Paresthesia of skin: Secondary | ICD-10-CM

## 2014-05-22 DIAGNOSIS — R200 Anesthesia of skin: Secondary | ICD-10-CM

## 2014-05-22 LAB — POCT CBC
Granulocyte percent: 56.7 %G (ref 37–80)
HCT, POC: 47.1 % (ref 43.5–53.7)
Hemoglobin: 16.1 g/dL (ref 14.1–18.1)
Lymph, poc: 3.8 — AB (ref 0.6–3.4)
MCH, POC: 31.3 pg — AB (ref 27–31.2)
MCHC: 34.1 g/dL (ref 31.8–35.4)
MCV: 91.5 fL (ref 80–97)
MID (cbc): 0.5 (ref 0–0.9)
MPV: 7.5 fL (ref 0–99.8)
POC Granulocyte: 5.6 (ref 2–6.9)
POC LYMPH PERCENT: 38.7 % (ref 10–50)
POC MID %: 4.6 % (ref 0–12)
Platelet Count, POC: 331 10*3/uL (ref 142–424)
RBC: 5.14 M/uL (ref 4.69–6.13)
RDW, POC: 12.7 %
WBC: 9.8 10*3/uL (ref 4.6–10.2)

## 2014-05-22 LAB — COMPLETE METABOLIC PANEL WITHOUT GFR
ALT: 28 U/L (ref 0–53)
AST: 17 U/L (ref 0–37)
Albumin: 4.6 g/dL (ref 3.5–5.2)
Alkaline Phosphatase: 102 U/L (ref 39–117)
BUN: 16 mg/dL (ref 6–23)
CO2: 25 meq/L (ref 19–32)
Calcium: 9.4 mg/dL (ref 8.4–10.5)
Chloride: 105 meq/L (ref 96–112)
Creat: 0.84 mg/dL (ref 0.50–1.35)
GFR, Est African American: >89 mL/min
GFR, Est Non African American: >89 mL/min
Glucose, Bld: 103 mg/dL — ABNORMAL HIGH (ref 70–99)
Potassium: 4.3 meq/L (ref 3.5–5.3)
Sodium: 139 meq/L (ref 135–145)
Total Bilirubin: 0.3 mg/dL (ref 0.2–1.2)
Total Protein: 7.3 g/dL (ref 6.0–8.3)

## 2014-05-22 LAB — POCT GLYCOSYLATED HEMOGLOBIN (HGB A1C): Hemoglobin A1C: 5.2

## 2014-05-22 LAB — TSH: TSH: 0.73 u[IU]/mL (ref 0.350–4.500)

## 2014-05-22 MED ORDER — GABAPENTIN 100 MG PO CAPS: ORAL_CAPSULE | ORAL | Status: DC

## 2014-05-22 NOTE — Progress Notes (Signed)
Chief Complaint:  Chief Complaint  Patient presents with  . Restless leg syndrome    x 1 month    HPI: Timothy Cowan is a 44 y.o. male who is here for 1 month history of restless legs bilaterally, he has jumping of the legs at night, he has tingling, has had an uncomfortable felling in his legs when he is sleeping, better ehn he is active, he can't sleep because he can't sleep still he decided to come in, last night was the worst night.. His GF's  mom has it and sells these little oils andhe tried it and  it did not work, last night he had 2 hours of sleep. Possibly he had similar sxs 4 years ago, he does not remember if it was this bad.  He wants to know if tying a bar of soap on his feet will help. I told him I am not sure but he can give it a try, it may be a great placebo effect. When he gets up and moves it is better. He is tryign to quit smoking, no hx of PAD or circulation problems, Deneis Diabetes, denies any nerve issues, denies any anemai. He has been hydrating and drinking more water, used to drink a lot of caffeien but has cut down drastically.   Past Medical History  Diagnosis Date  . Allergy    History reviewed. No pertinent past surgical history. History   Social History  . Marital Status: Single    Spouse Name: N/A    Number of Children: N/A  . Years of Education: N/A   Social History Main Topics  . Smoking status: Current Every Day Smoker -- 0.50 packs/day for 5 years    Types: Cigarettes  . Smokeless tobacco: None  . Alcohol Use: Yes     Comment: rarely  . Drug Use: No  . Sexual Activity: Yes   Other Topics Concern  . None   Social History Narrative   History reviewed. No pertinent family history. No Known Allergies Prior to Admission medications   Medication Sig Start Date End Date Taking? Authorizing Provider  loratadine-pseudoephedrine (CLARITIN-D 24-HOUR) 10-240 MG per 24 hr tablet Take 1 tablet by mouth daily.   Yes Historical Provider, MD    ipratropium (ATROVENT) 0.03 % nasal spray Place 2 sprays into the nose 2 (two) times daily. Patient not taking: Reported on 05/22/2014 03/03/14   Ethelda ChickKristi M Smith, MD     ROS: The patient denies fevers, chills, night sweats, unintentional weight loss, chest pain, palpitations, wheezing, dyspnea on exertion, nausea, vomiting, abdominal pain, dysuria, hematuria, melena, numbness, weakness, or tingling.  All other systems have been reviewed and were otherwise negative with the exception of those mentioned in the HPI and as above.    PHYSICAL EXAM: Filed Vitals:   05/22/14 1650  BP: 120/80  Pulse: 117  Temp: 97.6 F (36.4 C)  Resp: 16   Filed Vitals:   05/22/14 1650  Height: 5\' 10"  (1.778 m)  Weight: 187 lb (84.823 kg)   Body mass index is 26.83 kg/(m^2).  General: Alert, no acute distress HEENT:  Normocephalic, atraumatic, oropharynx patent. EOMI, PERRLA. No thyroid megaly Cardiovascular:  Regular rate and rhythm, no rubs murmurs or gallops.  No Carotid bruits, radial pulse intact. No pedal edema.  Respiratory: Clear to auscultation bilaterally.  No wheezes, rales, or rhonchi.  No cyanosis, no use of accessory musculature GI: No organomegaly, abdomen is soft and non-tender, positive bowel sounds.  No masses. Skin: No rashes. Neurologic: Facial musculature symmetric. Psychiatric: Patient is appropriate throughout our interaction. Lymphatic: No cervical lymphadenopathy Musculoskeletal: Gait intact. Full ROM 5/5 strength, 2/2 DTRs No saddle anesthesia Straight leg negative Hip and knee exam--normal    LABS: Results for orders placed or performed in visit on 05/22/14  POCT CBC  Result Value Ref Range   WBC 9.8 4.6 - 10.2 K/uL   Lymph, poc 3.8 (A) 0.6 - 3.4   POC LYMPH PERCENT 38.7 10 - 50 %L   MID (cbc) 0.5 0 - 0.9   POC MID % 4.6 0 - 12 %M   POC Granulocyte 5.6 2 - 6.9   Granulocyte percent 56.7 37 - 80 %G   RBC 5.14 4.69 - 6.13 M/uL   Hemoglobin 16.1 14.1 - 18.1 g/dL    HCT, POC 16.1 09.6 - 53.7 %   MCV 91.5 80 - 97 fL   MCH, POC 31.3 (A) 27 - 31.2 pg   MCHC 34.1 31.8 - 35.4 g/dL   RDW, POC 04.5 %   Platelet Count, POC 331 142 - 424 K/uL   MPV 7.5 0 - 99.8 fL  POCT glycosylated hemoglobin (Hb A1C)  Result Value Ref Range   Hemoglobin A1C 5.2      EKG/XRAY:   Primary read interpreted by Dr. Conley [facility]s at [facility].   ASSESSMENT/PLAN: Encounter Diagnoses  Name Primary?  . Restless legs Yes  . Numbness and tingling of left leg   . 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  F/u by phone , labs pending  Gross sideeffects, risk and benefits, and alternatives of medications d/w patient. Patient is aware that all medications have potential sideeffects and we are unable to predict every sideeffect or drug-drug interaction that may occur.  Hamilton Capri PHUONG, DO 05/22/2014 6:03 PM

## 2014-05-22 NOTE — Patient Instructions (Signed)
Restless Legs Syndrome Restless legs syndrome is a movement disorder. It may also be called a sensorimotor disorder.  CAUSES  No one knows what specifically causes restless legs syndrome, but it tends to run in families. It is also more common in people with low iron, in pregnancy, in people who need dialysis, and those with nerve damage (neuropathy).Some medications may make restless legs syndrome worse.Those medications include drugs to treat high blood pressure, some heart conditions, nausea, colds, allergies, and depression. SYMPTOMS Symptoms include uncomfortable sensations in the legs. These leg sensations are worse during periods of inactivity or rest. They are also worse while sitting or lying down. Individuals that have the disorder describe sensations in the legs that feel like:  Pulling.  Drawing.  Crawling.  Worming.  Boring.  Tingling.  Pins and needles.  Prickling.  Pain. The sensations are usually accompanied by an overwhelming urge to move the legs. Sudden muscle jerks may also occur. Movement provides temporary relief from the discomfort. In rare cases, the arms may also be affected. Symptoms may interfere with going to sleep (sleep onset insomnia). Restless legs syndrome may also be related to periodic limb movement disorder (PLMD). PLMD is another more common motor disorder. It also causes interrupted sleep. The symptoms from PLMD usually occur most often when you are awake. TREATMENT  Treatment for restless legs syndrome is symptomatic. This means that the symptoms are treated.   Massage and cold compresses may provide temporary relief.  Walk, stretch, or take a cold or hot bath.  Get regular exercise and a good night's sleep.  Avoid caffeine, alcohol, nicotine, and medications that can make it worse.  Do activities that provide mental stimulation like discussions, needlework, and video games. These may be helpful if you are not able to walk or stretch. Some  medications are effective in relieving the symptoms. However, many of these medications have side effects. Ask your caregiver about medications that may help your symptoms. Correcting iron deficiency may improve symptoms for some patients. Document Released: 04/23/2002 Document Revised: 09/17/2013 Document Reviewed: 07/30/2010 [facility] Patient Information 2015 [place], [place]. This information is not intended to replace advice given to you by your health care provider. Make sure you discuss any questions you have with your health care provider. Gabapentin capsules or tablets What is this medicine? GABAPENTIN (GA ba pen tin) is used to control partial seizures in adults with epilepsy. It is also used to treat certain types of nerve pain. This medicine may be used for other purposes; ask your health care provider or pharmacist if you have questions. COMMON BRAND NAME(S): Ascencion Dike, Neurontin What should I tell my health care provider before I take this medicine? They need to know if you have any of these conditions: -kidney disease -suicidal thoughts, plans, or attempt; a previous suicide attempt by you or a family member -an unusual or allergic reaction to gabapentin, other medicines, foods, dyes, or preservatives -pregnant or trying to get pregnant -breast-feeding How should I use this medicine? Take this medicine by mouth with a glass of water. Follow the directions on the prescription label. You can take it with or without food. If it upsets your stomach, take it with food.Take your medicine at regular intervals. Do not take it more often than directed. Do not stop taking except on your doctor's advice. If you are directed to break the 600 or 800 mg tablets in half as part of your dose, the extra half tablet should be used for the next dose.  If you have not used the extra half tablet within 28 days, it should be thrown away. A special MedGuide will be given to you by the pharmacist with each  prescription and refill. Be sure to read this information carefully each time. Talk to your pediatrician regarding the use of this medicine in children. Special care may be needed. Overdosage: If you think you have taken too much of this medicine contact a poison control center or emergency room at once. NOTE: This medicine is only for you. Do not share this medicine with others. What if I miss a dose? If you miss a dose, take it as soon as you can. If it is almost time for your next dose, take only that dose. Do not take double or extra doses. What may interact with this medicine? Do not take this medicine with any of the following medications: -other gabapentin products This medicine may also interact with the following medications: -alcohol -antacids -antihistamines for allergy, cough and cold -certain medicines for anxiety or sleep -certain medicines for depression or psychotic disturbances -homatropine; hydrocodone -naproxen -narcotic medicines (opiates) for pain -phenothiazines like chlorpromazine, mesoridazine, prochlorperazine, thioridazine This list may not describe all possible interactions. Give your health care provider a list of all the medicines, herbs, non-prescription drugs, or dietary supplements you use. Also tell them if you smoke, drink alcohol, or use illegal drugs. Some items may interact with your medicine. What should I watch for while using this medicine? Visit your doctor or health care professional for regular checks on your progress. You may want to keep a record at home of how you feel your condition is responding to treatment. You may want to share this information with your doctor or health care professional at each visit. You should contact your doctor or health care professional if your seizures get worse or if you have any new types of seizures. Do not stop taking this medicine or any of your seizure medicines unless instructed by your doctor or health care  professional. Stopping your medicine suddenly can increase your seizures or their severity. Wear a medical identification bracelet or chain if you are taking this medicine for seizures, and carry a card that lists all your medications. You may get drowsy, dizzy, or have blurred vision. Do not drive, use machinery, or do anything that needs mental alertness until you know how this medicine affects you. To reduce dizzy or fainting spells, do not sit or stand up quickly, especially if you are an older patient. Alcohol can increase drowsiness and dizziness. Avoid alcoholic drinks. Your mouth may get dry. Chewing sugarless gum or sucking hard candy, and drinking plenty of water will help. The use of this medicine may increase the chance of suicidal thoughts or actions. Pay special attention to how you are responding while on this medicine. Any worsening of mood, or thoughts of suicide or dying should be reported to your health care professional right away. Women who become pregnant while using this medicine may enroll in the [place] Antiepileptic Drug Pregnancy Registry by calling [phone]. This registry collects information about the safety of antiepileptic drug use during pregnancy. What side effects may I notice from receiving this medicine? Side effects that you should report to your doctor or health care professional as soon as possible: -allergic reactions like skin rash, itching or hives, swelling of the face, lips, or tongue -worsening of mood, thoughts or actions of suicide or dying Side effects that usually do not require medical attention (  report to your doctor or health care professional if they continue or are bothersome): -constipation -difficulty walking or controlling muscle movements -dizziness -nausea -slurred speech -tiredness -tremors -weight gain This list may not describe all possible side effects. Call your doctor for medical advice about side effects. You may report  side effects to FDA at 1-800-FDA-1088. Where should I keep my medicine? Keep out of reach of children. Store at room temperature between 15 and 30 degrees C (59 and 86 degrees F). Throw away any unused medicine after the expiration date. NOTE: This sheet is a summary. It may not cover all possible information. If you have questions about this medicine, talk to your doctor, pharmacist, or health care provider.  2015, [employer]. (2013-01-04 09:12:48)

## 2014-05-24 ENCOUNTER — Encounter: Payer: Self-pay | Admitting: Family Medicine

## 2014-05-27 ENCOUNTER — Encounter: Payer: Self-pay | Admitting: Family Medicine

## 2014-05-27 ENCOUNTER — Other Ambulatory Visit: Payer: Self-pay | Admitting: Family Medicine

## 2014-05-27 MED ORDER — ZOLPIDEM TARTRATE 5 MG PO TABS: 5.0000 mg | ORAL_TABLET | Freq: Every evening | ORAL | Status: DC | PRN

## 2014-06-18 ENCOUNTER — Other Ambulatory Visit: Payer: Self-pay | Admitting: Family Medicine

## 2014-06-18 NOTE — Telephone Encounter (Signed)
Dr Conley RollsLe, do you want to give RFs? According to pt's email to you he needs to take 3 tabs Qhs for restless legs. I changed the sig to reflect this. Please review.

## 2014-07-12 ENCOUNTER — Other Ambulatory Visit: Payer: Self-pay

## 2014-07-16 ENCOUNTER — Other Ambulatory Visit: Payer: Self-pay

## 2014-07-16 MED ORDER — GABAPENTIN 100 MG PO CAPS: 300.0000 mg | ORAL_CAPSULE | Freq: Every evening | ORAL | Status: DC | PRN

## 2014-07-19 ENCOUNTER — Other Ambulatory Visit: Payer: Self-pay | Admitting: Family Medicine

## 2014-07-19 MED ORDER — GABAPENTIN 100 MG PO CAPS: 300.0000 mg | ORAL_CAPSULE | Freq: Every evening | ORAL | Status: DC | PRN

## 2014-07-28 ENCOUNTER — Other Ambulatory Visit: Payer: Self-pay | Admitting: Family Medicine

## 2014-07-28 MED ORDER — ROPINIROLE HCL 0.25 MG PO TABS: ORAL_TABLET | ORAL | Status: DC

## 2014-08-12 ENCOUNTER — Other Ambulatory Visit: Payer: Self-pay | Admitting: Family Medicine

## 2014-08-13 NOTE — Telephone Encounter (Signed)
Dr Conley RollsLe, do you want to give pt RFs? I inc quantity to reflect up to 2 Qhs (#60)

## 2014-09-06 ENCOUNTER — Other Ambulatory Visit: Payer: Self-pay

## 2014-09-06 MED ORDER — ROPINIROLE HCL 0.25 MG PO TABS: ORAL_TABLET | ORAL | Status: DC

## 2015-01-29 ENCOUNTER — Encounter (HOSPITAL_COMMUNITY): Payer: Self-pay | Admitting: Emergency Medicine

## 2015-01-29 ENCOUNTER — Emergency Department (HOSPITAL_COMMUNITY)
Admission: EM | Admit: 2015-01-29 | Discharge: 2015-01-29 | Disposition: A | Discharge status: Home / Self Care | Payer: BLUE CROSS/BLUE SHIELD | Attending: Emergency Medicine | Admitting: Emergency Medicine

## 2015-01-29 DIAGNOSIS — R1013 Epigastric pain: Secondary | ICD-10-CM | POA: Insufficient documentation

## 2015-01-29 DIAGNOSIS — Z720 Tobacco use: Secondary | ICD-10-CM | POA: Diagnosis not present

## 2015-01-29 DIAGNOSIS — R1031 Right lower quadrant pain: Secondary | ICD-10-CM | POA: Insufficient documentation

## 2015-01-29 DIAGNOSIS — Z79899 Other long term (current) drug therapy: Secondary | ICD-10-CM | POA: Insufficient documentation

## 2015-01-29 LAB — CBC WITH DIFFERENTIAL/PLATELET
BASOS PCT: 1 %
Basophils Absolute: 0 10*3/uL (ref 0.0–0.1)
EOS ABS: 0.1 10*3/uL (ref 0.0–0.7)
Eosinophils Relative: 1 %
HCT: 41.5 % (ref 39.0–52.0)
Hemoglobin: 14.7 g/dL (ref 13.0–17.0)
Lymphocytes Relative: 46 %
Lymphs Abs: 3.3 10*3/uL (ref 0.7–4.0)
MCH: 31.7 pg (ref 26.0–34.0)
MCHC: 35.4 g/dL (ref 30.0–36.0)
MCV: 89.4 fL (ref 78.0–100.0)
MONO ABS: 0.4 10*3/uL (ref 0.1–1.0)
MONOS PCT: 6 %
NEUTROS PCT: 46 %
Neutro Abs: 3.3 10*3/uL (ref 1.7–7.7)
Platelets: 278 10*3/uL (ref 150–400)
RBC: 4.64 MIL/uL (ref 4.22–5.81)
RDW: 12.4 % (ref 11.5–15.5)
WBC: 7.1 10*3/uL (ref 4.0–10.5)

## 2015-01-29 LAB — COMPREHENSIVE METABOLIC PANEL
ALBUMIN: 4.7 g/dL (ref 3.5–5.0)
ALT: 26 U/L (ref 17–63)
ANION GAP: 5 (ref 5–15)
AST: 18 U/L (ref 15–41)
Alkaline Phosphatase: 84 U/L (ref 38–126)
BUN: 13 mg/dL (ref 6–20)
CHLORIDE: 105 mmol/L (ref 101–111)
CO2: 28 mmol/L (ref 22–32)
Calcium: 9.3 mg/dL (ref 8.9–10.3)
Creatinine, Ser: 0.81 mg/dL (ref 0.61–1.24)
GFR calc Af Amer: >60 mL/min (ref >60)
GFR calc non Af Amer: >60 mL/min (ref >60)
GLUCOSE: 101 mg/dL — AB (ref 65–99)
POTASSIUM: 3.9 mmol/L (ref 3.5–5.1)
SODIUM: 138 mmol/L (ref 135–145)
TOTAL PROTEIN: 7.5 g/dL (ref 6.5–8.1)
Total Bilirubin: 0.7 mg/dL (ref 0.3–1.2)

## 2015-01-29 LAB — URINALYSIS, ROUTINE W REFLEX MICROSCOPIC
BILIRUBIN URINE: NEGATIVE
Glucose, UA: NEGATIVE mg/dL
Hgb urine dipstick: NEGATIVE
Ketones, ur: NEGATIVE mg/dL
Leukocytes, UA: NEGATIVE
NITRITE: NEGATIVE
Protein, ur: NEGATIVE mg/dL
SPECIFIC GRAVITY, URINE: 1.015 (ref 1.005–1.030)
UROBILINOGEN UA: 0.2 mg/dL (ref 0.0–1.0)
pH: 6 (ref 5.0–8.0)

## 2015-01-29 LAB — LIPASE, BLOOD: Lipase: 22 U/L (ref 22–51)

## 2015-01-29 NOTE — ED Provider Notes (Signed)
CSN: [id number]     Arrival date & time 01/29/15  0941 History   First MD Initiated Contact with Patient 01/29/15 [phone]     Chief Complaint  Patient presents with  . Abdominal Pain     (Consider location/radiation/quality/duration/timing/severity/associated sxs/prior Treatment) Patient is a 44 y.o. male presenting with abdominal pain.  Abdominal Pain Pain location:  Epigastric Pain quality: sharp   Pain radiates to:  Does not radiate Pain severity:  Severe Onset quality:  Sudden Duration: 10 minutes. Timing:  Constant Progression:  Resolved Chronicity:  New Context comment:  Occured while at work, no exerting self, not eating.  He had not eaten breakfast (as is customary for him).   Relieved by:  Nothing Worsened by:  Nothing tried Ineffective treatments:  None tried Associated symptoms: no constipation, no diarrhea, no dysuria, no fever, no hematuria, no nausea and no vomiting     Past Medical History  Diagnosis Date  . Allergy    History reviewed. No pertinent past surgical history. No family history on file. Social History  Substance Use Topics  . Smoking status: Current Every Day Smoker -- 0.50 packs/day for 5 years    Types: Cigarettes  . Smokeless tobacco: None  . Alcohol Use: Yes     Comment: rarely    Review of Systems  Constitutional: Negative for fever.  Gastrointestinal: Positive for abdominal pain. Negative for nausea, vomiting, diarrhea and constipation.  Genitourinary: Negative for dysuria and hematuria.  All other systems reviewed and are negative.     Allergies  Review of patient's allergies indicates no known allergies.  Home Medications   Prior to Admission medications   Medication Sig Start Date End Date Taking? Authorizing Provider  ibuprofen (ADVIL,MOTRIN) 200 MG tablet Take 800 mg by mouth every 6 (six) hours as needed for headache.   Yes Historical Provider, MD  loratadine-pseudoephedrine (CLARITIN-D 24-HOUR) 10-240 MG per 24 hr tablet  Take 1 tablet by mouth daily.   Yes Historical Provider, MD  rOPINIRole (REQUIP) 0.25 MG tablet TAKE 1 TAB BY MOUTH 1-3 HOURS BEFORE BEDTIME, MAY INCREASE TO 2 TABS NIGHTLY IF NEEDED AFTER 3 DAYS 09/06/14  Yes Thao P Le, DO  gabapentin (NEURONTIN) 100 MG capsule Take 3 capsules (300 mg total) by mouth at bedtime as needed. Patient not taking: Reported on 01/29/2015 07/19/14   Thao P Le, DO  ipratropium (ATROVENT) 0.03 % nasal spray Place 2 sprays into the nose 2 (two) times daily. Patient not taking: Reported on 05/22/2014 03/03/14   Ethelda Chick, MD  zolpidem (AMBIEN) 5 MG tablet Take 1 tablet (5 mg total) by mouth at bedtime as needed for sleep. May cause dizziness Patient not taking: Reported on 01/29/2015 05/27/14   Thao P Le, DO   BP 131/81 mmHg  Pulse 98  Temp(Src) 98.1 F (36.7 C) (Oral)  Resp 18  SpO2 98% Physical Exam  Constitutional: He is oriented to person, place, and time. He appears well-developed and well-nourished. No distress.  HENT:  Head: Normocephalic and atraumatic.  Mouth/Throat: Oropharynx is clear and moist.  Eyes: Conjunctivae are normal. Pupils are equal, round, and reactive to light. No scleral icterus.  Neck: Neck supple.  Cardiovascular: Normal rate, regular rhythm, normal heart sounds and intact distal pulses.   No murmur heard. Pulmonary/Chest: Effort normal and breath sounds normal. No stridor. No respiratory distress. He has no wheezes. He has no rales.  Abdominal: Soft. He exhibits no distension. There is tenderness (minimal epigastric and RLQ tenderness.  no  r/r/g.). Hernia confirmed negative in the right inguinal area and confirmed negative in the left inguinal area.  Genitourinary: Penis normal. Right testis shows no swelling and no tenderness. Left testis shows no swelling and no tenderness.  Musculoskeletal: Normal range of motion. He exhibits no edema.  Lymphadenopathy:       Right: No inguinal adenopathy present.       Left: No inguinal adenopathy  present.  Neurological: He is alert and oriented to person, place, and time.  Skin: Skin is warm and dry. No rash noted.  Psychiatric: He has a normal mood and affect. His behavior is normal.  Nursing note and vitals reviewed.   ED Course  Procedures (including critical care time) Labs Review Labs Reviewed  COMPREHENSIVE METABOLIC PANEL - Abnormal; Notable for the following:    Glucose, Bld 101 (*)    All other components within normal limits  CBC WITH DIFFERENTIAL/PLATELET  LIPASE, BLOOD  URINALYSIS, ROUTINE W REFLEX MICROSCOPIC (NOT AT [facility])    Imaging Review No results found. I have personally reviewed and evaluated these images and lab results as part of my medical decision-making.   EKG Interpretation None      MDM   Final diagnoses:  Epigastric abdominal pain    Pt had about 10 minutes of severe abdominal epigastric pain, which resolved spontaneously.  Likely GI related, as he had not eaten anything for breakfast, but did have coffee.  Atypical for pancreatitis, cholecystitis, appendicitis, bowel obstruction, kidney stone.   Pain has remained resolved.  Labs reassuring.  Repeat abdominal exam showed no tenderness at all.  Plan dc.  Have given return precautions.   Blake Divine, MD 01/29/15 [id number]

## 2015-01-29 NOTE — ED Notes (Signed)
Patient denies pain and is resting comfortably.  

## 2015-01-29 NOTE — Discharge Instructions (Signed)

## 2015-01-29 NOTE — ED Notes (Signed)
Pt from work co mid abd pain that started 1 hour ago, denies nausea nor emesis. Tender at palpation per ems. Denies diarrhea. Hx kidney stone yet pt denies flank pain nor urinary symptoms .

## 2015-01-29 NOTE — ED Notes (Signed)
Bed: WU98 Expected date:  Expected time:  Means of arrival:  Comments: EMS- 44yo M, abdominal pain

## 2015-04-14 ENCOUNTER — Other Ambulatory Visit: Payer: Self-pay | Admitting: Family Medicine

## 2015-06-08 ENCOUNTER — Other Ambulatory Visit: Payer: Self-pay | Admitting: Family Medicine

## 2015-10-02 ENCOUNTER — Emergency Department (HOSPITAL_COMMUNITY)
Admission: EM | Admit: 2015-10-02 | Discharge: 2015-10-02 | Disposition: A | Discharge status: Home / Self Care | Payer: BLUE CROSS/BLUE SHIELD | Attending: Emergency Medicine | Admitting: Emergency Medicine

## 2015-10-02 ENCOUNTER — Encounter (HOSPITAL_COMMUNITY): Payer: Self-pay | Admitting: *Deleted

## 2015-10-02 DIAGNOSIS — Z791 Long term (current) use of non-steroidal anti-inflammatories (NSAID): Secondary | ICD-10-CM | POA: Insufficient documentation

## 2015-10-02 DIAGNOSIS — Z87891 Personal history of nicotine dependence: Secondary | ICD-10-CM | POA: Insufficient documentation

## 2015-10-02 DIAGNOSIS — L989 Disorder of the skin and subcutaneous tissue, unspecified: Secondary | ICD-10-CM | POA: Insufficient documentation

## 2015-10-02 DIAGNOSIS — R229 Localized swelling, mass and lump, unspecified: Secondary | ICD-10-CM

## 2015-10-02 DIAGNOSIS — Z79899 Other long term (current) drug therapy: Secondary | ICD-10-CM | POA: Insufficient documentation

## 2015-10-02 MED ORDER — POLYMYXIN B-TRIMETHOPRIM 10000-0.1 UNIT/ML-% OP SOLN: 1.0000 [drp] | Freq: Four times a day (QID) | OPHTHALMIC | Status: DC

## 2015-10-02 MED ORDER — LIDOCAINE-EPINEPHRINE-TETRACAINE (LET) SOLUTION
3.0000 mL | Freq: Once | NASAL | Status: AC
  Administered 2015-10-02: 3 mL via TOPICAL
  Filled 2015-10-02: qty 3

## 2015-10-02 NOTE — ED Provider Notes (Signed)
CSN: [id number]     Arrival date & time 10/02/15  1002 History  By signing my name below, I, Marisue HumbleMichelle Chaffee, attest that this documentation has been prepared under the direction and in the presence of non-physician practitioner, Trixie DredgeEmily Hisao Doo, PA-C. Electronically Signed: Marisue HumbleMichelle Chaffee, Scribe. 10/02/2015. 1:12 PM.   Chief Complaint  Patient presents with  . Eye Problem    The history is provided by the patient. No language interpreter was used.   HPI Comments:  Timothy Cowan is a 45 y.o. male who presents to the Emergency Department complaining of cyst on outer left eye for the past 2 years, growing larger in the past month. Pt reports associated left eye discomfort beginning a few days ago and intermittent blurry vision in left eye. Feels it is starting to push on his eye and is always in his peripheral vision.  No alleviating or exacerbating factors noted. Denies fever, headache, discharge from eye, eye pain, difficulty moving eye, sinus problems or drainage from cyst.  Past Medical History  Diagnosis Date  . Allergy    History reviewed. No pertinent past surgical history. No family history on file. Social History  Substance Use Topics  . Smoking status: Former Smoker -- 0.50 packs/day for 5 years    Types: Cigarettes    Quit date: 09/25/2015  . Smokeless tobacco: None  . Alcohol Use: Yes     Comment: rarely    Review of Systems  Constitutional: Negative for fever.  HENT: Negative for congestion, facial swelling and sinus pressure.   Eyes: Positive for visual disturbance. Negative for photophobia, pain, discharge and redness.       Cyst to outer left eye  Skin: Negative for color change.  Allergic/Immunologic: Negative for immunocompromised state.  Neurological: Negative for headaches.  Hematological: Does not bruise/bleed easily.  Psychiatric/Behavioral: Negative for self-injury.   Allergies  Review of patient's allergies indicates no known allergies.  Home Medications    Prior to Admission medications   Medication Sig Start Date End Date Taking? Authorizing Provider  gabapentin (NEURONTIN) 100 MG capsule Take 3 capsules by mouth at bedtime  As needed   "office visit needed for refills" Patient taking differently: Take 300 mg by mouth at bedtime as needed (restless leg syndrome). Take 3 capsules by mouth at bedtime  As needed   "office visit needed for refills" 04/15/15  Yes Thao P Le, DO  ibuprofen (ADVIL,MOTRIN) 200 MG tablet Take 800 mg by mouth every 6 (six) hours as needed for headache.   Yes Historical Provider, MD  ipratropium (ATROVENT) 0.03 % nasal spray Place 2 sprays into the nose 2 (two) times daily. Patient not taking: Reported on 05/22/2014 03/03/14   Ethelda ChickKristi M Smith, MD  rOPINIRole (REQUIP) 0.25 MG tablet TAKE 1 TAB BY MOUTH 1-3 HOURS BEFORE BEDTIME, MAY INCREASE TO 2 TABS NIGHTLY IF NEEDED AFTER 3 DAYS Patient not taking: Reported on 10/02/2015 09/06/14   Thao P Le, DO  zolpidem (AMBIEN) 5 MG tablet Take 1 tablet (5 mg total) by mouth at bedtime as needed for sleep. May cause dizziness Patient not taking: Reported on 01/29/2015 05/27/14   Thao P Le, DO   BP 130/87 mmHg  Pulse 85  Temp(Src) 97.6 F (36.4 C) (Oral)  Resp 13  Ht 5\' 9"  (1.753 m)  Wt 175 lb (79.379 kg)  BMI 25.83 kg/m2  SpO2 95% Physical Exam  Constitutional: He appears well-developed and well-nourished. No distress.  HENT:  Head: Normocephalic and atraumatic.  Nose: Left sinus  exhibits no maxillary sinus tenderness and no frontal sinus tenderness.  Eyes: Conjunctivae and EOM are normal. Pupils are equal, round, and reactive to light. Right eye exhibits no discharge. Left eye exhibits no discharge. Left conjunctiva is not injected. Left conjunctiva has no hemorrhage. No scleral icterus.    Neck: Normal range of motion. Neck supple.  Cardiovascular: Normal rate.   Pulmonary/Chest: Effort normal.  Neurological: He is alert.  Skin: He is not diaphoretic.  Nursing note and vitals  reviewed.   ED Course  Procedures  DIAGNOSTIC STUDIES:  Oxygen Saturation is 95% on RA, normal by my interpretation.    COORDINATION OF CARE:  12:36 PM Discussed risks of opening this chronic cyst-like structure including introducing infection, abnormal scarring, but pt does want me to try.  Will not remove skin as this is a more complex procedure appropriately performed by specialist.  Will apply topical numbing and attempt to drain cyst with 18 gauge needle. Recommended pt follow-up with a specialist. Discussed treatment plan with pt at bedside and pt agreed to plan.  Labs Review Labs Reviewed - No data to display  Imaging Review No results found. I have personally reviewed and evaluated these images and lab results as part of my medical decision-making.   EKG Interpretation None       INCISION AND DRAINAGE Performed by: Trixie Dredge Consent: Verbal consent obtained. Risks and benefits: risks, benefits and alternatives were discussed Type: cyst  Body area: left lateral eye  Anesthesia: topical  Cleansed thoroughly with betadine that was allowed to dry.    Single stab Incision was made with an 18 gauge needle  Local anesthetic: LET  Anesthetic total: small amount applied with qtip swab  Complexity: simple   Drainage: thin clear fluid  Drainage amount: large.  Complete deflated cystic structure  Packing material: none  Patient tolerance: Patient tolerated the procedure well with no immediate complications.     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  See discussion above regarding discussion of pros and cons of draining this.  I have advised him that ultimately he will need to see a specialist yet he very much wanted me to see if I could get anything out of it to deflate it somewhat.  The I&D with small intrusion of an 18 gauge needle allowed  complete drainage of clear thin liquid.  Pt felt relief afterwards.  Pt advised to follow closely with specialist.  Discussed healing and possibility of infection.  Unclear after drainage if related to tear duct given the consistency of fluid.      D/C home with plastic surgery, ophthalmology follow up, polytrim eye drops.   Discussed result, findings, treatment, and follow up  with patient.  Pt given return precautions.  Pt verbalizes understanding and agrees with plan.         I personally performed the services described in this documentation, which was scribed in my presence. The recorded information has been reviewed and is accurate.   Trixie Dredge, PA-C 10/02/15 1346  Alvira Monday, MD 10/07/15 1317

## 2015-10-02 NOTE — ED Notes (Signed)
Pt reports ?cyst in L eye for 2 years.  Has gotten big in the past 2 months.  Blurred vision noted x 2 days in his L eye.  Denies severe pain but slight soreness.

## 2015-10-02 NOTE — ED Notes (Signed)
Bed: WA27 Expected date:  Expected time:  Means of arrival:  Comments: 

## 2015-10-02 NOTE — Discharge Instructions (Signed)
Read the information below.  Use the prescribed medication as directed.  Please discuss all new medications with your pharmacist.  You may return to the Emergency Department at any time for worsening condition or any new symptoms that concern you.    If you develop redness, swelling, pus draining from the wound, or fevers greater than 100.4, return to the ER immediately for a recheck.   If you develop worsening pain in your eye, change in your vision, swelling around your eye, difficulty moving your eye, or fevers greater than 100.4, see your eye doctor or return to the Emergency Department immediately for a recheck.      Excision of Skin Lesions, Care After Refer to this sheet in the next few weeks. These instructions provide you with information about caring for yourself after your procedure. Your health care provider may also give you more specific instructions. Your treatment has been planned according to current medical practices, but problems sometimes occur. Call your health care provider if you have any problems or questions after your procedure. WHAT TO EXPECT AFTER THE PROCEDURE After your procedure, it is common to have pain or discomfort at the excision site. HOME CARE INSTRUCTIONS  Take over-the-counter and prescription medicines only as told by your health care provider.  Follow instructions from your health care provider about:  How to take care of your excision site. You should keep the site clean, dry, and protected for at least 48 hours.  When and how you should change your bandage (dressing).  When you should remove your dressing.  Removing whatever was used to close your excision site.  Check the excision area every day for signs of infection. Watch for:  Redness, swelling, or pain.  Fluid, blood, or pus.  For bleeding, apply gentle but firm pressure to the area using a folded towel for 20 minutes.  Avoid high-impact exercise and activities until the stitches  (sutures) are removed or the area heals.  Follow instructions from your health care provider about how to minimize scarring. Avoid sun exposure until the area has healed. Scarring should lessen over time.  Keep all follow-up visits as told by your health care provider. This is important. SEEK MEDICAL CARE IF:  You have a fever.  You have redness, swelling, or pain at the excision site.  You have fluid, blood, or pus coming from the excision site.  You have ongoing bleeding at the excision site.  You have pain that does not improve in 2-3 days after your procedure.  You notice skin irregularities or changes in sensation.   This information is not intended to replace advice given to you by your health care provider. Make sure you discuss any questions you have with your health care provider.   Document Released: 09/17/2014 Document Reviewed: 09/17/2014 Elsevier Interactive Patient Education 2016 [employer].  Excision of Skin Lesions Excision of a skin lesion refers to the removal of a section of skin by making small cuts (incisions) in the skin. This procedure may be done to remove a cancerous (malignant) or noncancerous (benign) growth on the skin. It is typically done to treat or prevent cancer or infection. It may also be done to improve cosmetic appearance. The procedure may be done to remove:  Cancerous growths, such as basal cell carcinoma, squamous cell carcinoma, or melanoma.  Noncancerous growths, such as a cyst or lipoma.  Growths, such as moles or skin tags, which may be removed for cosmetic reasons. Various excision or surgical techniques  may be used depending on your condition, the location of the lesion, and your overall health. LET [facility] CARE PROVIDER KNOW ABOUT:  Any allergies you have.  All medicines you are taking, including vitamins, herbs, eye drops, creams, and over-the-counter medicines.  Previous problems you or members of your family have had  with the use of anesthetics.  Any blood disorders you have.  Previous surgeries you have had.  Any medical conditions you have.  Whether you are pregnant or may be pregnant. RISKS AND COMPLICATIONS Generally, this is a safe procedure. However, problems may occur, including:  Bleeding.  Infection.  Scarring.  Recurrence of the cyst, lipoma, or cancer.  Changes in skin sensation or appearance, such as discoloration or swelling.  Reaction to the anesthetics.  Allergic reaction to surgical materials or ointments.  Damage to nerves, blood vessels, muscles, or other structures.  Continued pain. BEFORE THE PROCEDURE  Ask your health care provider about:  Changing or stopping your regular medicines. This is especially important if you are taking diabetes medicines or blood thinners.  Taking medicines such as aspirin and ibuprofen. These medicines can thin your blood. Do not take these medicines before your procedure if your health care provider instructs you not to.  You may be asked to take certain medicines.  You may be asked to stop smoking.  You may have an exam or testing.  Plan to have someone take you home after the procedure.  Plan to have someone help you with activities during recovery. PROCEDURE  To reduce your risk of infection:  Your health care team will wash or sanitize their hands.  Your skin will be washed with soap.  You will be given a medicine to numb the area (local anesthetic).  One of the following excision techniques will be performed.  At the end of any of these procedures, antibiotic ointment will be applied as needed. Each of the following techniques may vary among health care providers and hospitals. Complete Surgical Excision The area of skin that needs to be removed will be marked with a pen. Using a small scalpel or scissors, the surgeon will gently cut around and under the lesion until it is completely removed. The lesion will be  placed in a fluid and sent to the lab for examination. If necessary, bleeding will be controlled with a device that delivers heat (electrocautery). The edges of the wound may be stitched (sutured) together, and a bandage (dressing) will be applied. This procedure may be performed to treat a cancerous growth or a noncancerous cyst or lesion. Excision of a Cyst The surgeon will make an incision on the cyst. The entire cyst will be removed through the incision. The incision may be closed with sutures. Shave Excision During shave excision, the surgeon will use a small blade or an electrically heated loop instrument to shave off the lesion. This may be done to remove a mole or a skin tag. The wound will usually be left to heal on its own without sutures. Punch Excision During punch excision, the surgeon will use a small tool that is like a cookie cutter or a hole punch to cut a circle shape out of the skin. The outer edges of the skin will be sutured together. This may be done to remove a mole or a scar or to perform a biopsy of the lesion. Mohs Micrographic Surgery During Mohs micrographic surgery, layers of the lesion will be removed with a scalpel or a loop instrument and  will be examined right away under a microscope. Layers will be removed until all of the abnormal or cancerous tissue has been removed. This procedure is minimally invasive, and it ensures the best cosmetic outcome. It involves the removal of as little normal tissue as possible. Mohs is usually done to treat skin cancer, such as basal cell carcinoma or squamous cell carcinoma, particularly on the face and ears. Depending on the size of the surgical wound, it may be sutured closed. AFTER THE PROCEDURE  Return to your normal activities as told by your health care provider.  Talk with your health care provider to discuss any test results, treatment options, and if necessary, the need for more tests.   This information is not intended to  replace advice given to you by your health care provider. Make sure you discuss any questions you have with your health care provider.   Document Released: 07/28/2009 Document Revised: 01/22/2015 Document Reviewed: 06/19/2014 Elsevier Interactive Patient Education [employer] Elsevier Inc.

## 2016-02-07 ENCOUNTER — Ambulatory Visit (INDEPENDENT_AMBULATORY_CARE_PROVIDER_SITE_OTHER): Payer: Self-pay | Admitting: Family Medicine

## 2016-02-07 VITALS — BP 126/84 | HR 98 | Temp 98.2°F | Resp 16 | Ht 69.0 in | Wt 184.6 lb

## 2016-02-07 DIAGNOSIS — Z720 Tobacco use: Secondary | ICD-10-CM | POA: Diagnosis not present

## 2016-02-07 DIAGNOSIS — L729 Follicular cyst of the skin and subcutaneous tissue, unspecified: Secondary | ICD-10-CM | POA: Diagnosis not present

## 2016-02-07 DIAGNOSIS — G2581 Restless legs syndrome: Secondary | ICD-10-CM | POA: Diagnosis not present

## 2016-02-07 DIAGNOSIS — F17200 Nicotine dependence, unspecified, uncomplicated: Secondary | ICD-10-CM

## 2016-02-07 MED ORDER — VARENICLINE TARTRATE 0.5 MG PO TABS: 0.5000 mg | ORAL_TABLET | Freq: Two times a day (BID) | ORAL | 5 refills | Status: AC

## 2016-02-07 MED ORDER — GABAPENTIN 100 MG PO CAPS: ORAL_CAPSULE | ORAL | 3 refills | Status: DC

## 2016-02-07 NOTE — Progress Notes (Signed)
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  Restless legs syndrome: doing well on gabapentin 300 mg 1-3 tabs at hs.  Failed Requip and iron.  2. Request for Chantix.  Has tried other things and failed in the past.  Currently smokes a pack a day.  Quit for 6 months a couple years ago.  3.  Left lateral eye cyst.  Has been I&D'd before but now has come back and is annoying.  Objective:  BP 126/84   Pulse 98   Temp 98.2 F (36.8 C) (Oral)   Resp 16   Ht 5\' 9"  (1.753 m)   Wt 184 lb 9.6 oz (83.7 kg)   SpO2 97%   BMI 27.26 kg/m   After sterile betadine prep, the 3 mm skin cyst was I&D'd but only minimal clear fluid expressed.  Assessment:  Restless legs syndrome (RLS) - Plan: gabapentin (NEURONTIN) 100 MG capsule  Smoking - Plan: varenicline (CHANTIX) 0.5 MG tablet  Benign skin cyst  Elvina SidleKurt Luan Urbani, MD

## 2016-02-07 NOTE — Patient Instructions (Addendum)
  Dr. [facility]:  [phone]   IF you received an x-ray today, you will receive an invoice from [facility]. Please contact [facility] at [phone] with questions or concerns regarding your invoice.   IF you received labwork today, you will receive an invoice from [facility]. Please contact [facility] at [phone] with questions or concerns regarding your invoice.   Our billing staff will not be able to assist you with questions regarding bills from these companies.  You will be contacted with the lab results as soon as they are available. The fastest way to get your results is to activate your My Chart account. Instructions are located on the last page of this paperwork. If you have not heard from us regarding the results in 2 weeks, please contact this office.

## 2016-02-14 ENCOUNTER — Telehealth: Payer: Self-pay

## 2016-02-14 NOTE — Telephone Encounter (Signed)
Needs to talk with some one about increasing his chantix dosage   Best number [phone]

## 2016-02-16 NOTE — Telephone Encounter (Signed)
Attempted to call no answer or voice mail set up.

## 2016-02-23 NOTE — Telephone Encounter (Signed)
Sent pt unable to reach letter advising to RTC to discuss any med changes w/provider.

## 2016-04-18 ENCOUNTER — Encounter: Payer: Self-pay | Admitting: Family Medicine

## 2016-04-28 ENCOUNTER — Emergency Department (HOSPITAL_COMMUNITY)
Admission: EM | Admit: 2016-04-28 | Discharge: 2016-04-28 | Disposition: A | Discharge status: Home / Self Care | Payer: BLUE CROSS/BLUE SHIELD | Attending: Emergency Medicine | Admitting: Emergency Medicine

## 2016-04-28 ENCOUNTER — Emergency Department (HOSPITAL_COMMUNITY): Payer: BLUE CROSS/BLUE SHIELD

## 2016-04-28 ENCOUNTER — Encounter (HOSPITAL_COMMUNITY): Payer: Self-pay | Admitting: Emergency Medicine

## 2016-04-28 DIAGNOSIS — N200 Calculus of kidney: Secondary | ICD-10-CM

## 2016-04-28 DIAGNOSIS — F17210 Nicotine dependence, cigarettes, uncomplicated: Secondary | ICD-10-CM | POA: Diagnosis not present

## 2016-04-28 DIAGNOSIS — N1330 Unspecified hydronephrosis: Secondary | ICD-10-CM | POA: Insufficient documentation

## 2016-04-28 DIAGNOSIS — R1031 Right lower quadrant pain: Secondary | ICD-10-CM | POA: Diagnosis not present

## 2016-04-28 DIAGNOSIS — R109 Unspecified abdominal pain: Secondary | ICD-10-CM

## 2016-04-28 DIAGNOSIS — N202 Calculus of kidney with calculus of ureter: Secondary | ICD-10-CM | POA: Insufficient documentation

## 2016-04-28 DIAGNOSIS — N132 Hydronephrosis with renal and ureteral calculous obstruction: Secondary | ICD-10-CM | POA: Diagnosis not present

## 2016-04-28 DIAGNOSIS — N201 Calculus of ureter: Secondary | ICD-10-CM

## 2016-04-28 HISTORY — DX: Calculus of kidney: N20.0

## 2016-04-28 LAB — URINALYSIS, ROUTINE W REFLEX MICROSCOPIC
BILIRUBIN URINE: NEGATIVE
GLUCOSE, UA: 50 mg/dL — AB
KETONES UR: NEGATIVE mg/dL
Leukocytes, UA: NEGATIVE
Nitrite: NEGATIVE
PROTEIN: 100 mg/dL — AB
SQUAMOUS EPITHELIAL / LPF: NONE SEEN
Specific Gravity, Urine: 1.028 (ref 1.005–1.030)
pH: 5 (ref 5.0–8.0)

## 2016-04-28 LAB — CBC
HCT: 44.4 % (ref 39.0–52.0)
HEMOGLOBIN: 15.9 g/dL (ref 13.0–17.0)
MCH: 32.4 pg (ref 26.0–34.0)
MCHC: 35.8 g/dL (ref 30.0–36.0)
MCV: 90.4 fL (ref 78.0–100.0)
PLATELETS: 314 10*3/uL (ref 150–400)
RBC: 4.91 MIL/uL (ref 4.22–5.81)
RDW: 12.7 % (ref 11.5–15.5)
WBC: 8.9 10*3/uL (ref 4.0–10.5)

## 2016-04-28 LAB — BASIC METABOLIC PANEL
ANION GAP: 8 (ref 5–15)
BUN: 16 mg/dL (ref 6–20)
CO2: 27 mmol/L (ref 22–32)
CREATININE: 0.94 mg/dL (ref 0.61–1.24)
Calcium: 9.4 mg/dL (ref 8.9–10.3)
Chloride: 104 mmol/L (ref 101–111)
GLUCOSE: 156 mg/dL — AB (ref 65–99)
Potassium: 3.8 mmol/L (ref 3.5–5.1)
Sodium: 139 mmol/L (ref 135–145)

## 2016-04-28 MED ORDER — SULFAMETHOXAZOLE-TRIMETHOPRIM 800-160 MG PO TABS: 1.0000 | ORAL_TABLET | Freq: Two times a day (BID) | ORAL | 0 refills | Status: AC

## 2016-04-28 MED ORDER — KETOROLAC TROMETHAMINE 30 MG/ML IJ SOLN
30.0000 mg | Freq: Once | INTRAMUSCULAR | Status: AC
Start: 2016-04-28 — End: 2016-04-28
  Administered 2016-04-28: 30 mg via INTRAVENOUS
  Filled 2016-04-28: qty 1

## 2016-04-28 MED ORDER — ONDANSETRON HCL 4 MG/2ML IJ SOLN
4.0000 mg | Freq: Once | INTRAMUSCULAR | Status: AC
  Administered 2016-04-28: 4 mg via INTRAVENOUS
  Filled 2016-04-28: qty 2

## 2016-04-28 MED ORDER — HYDROCODONE-ACETAMINOPHEN 5-325 MG PO TABS: 2.0000 | ORAL_TABLET | ORAL | 0 refills | Status: DC | PRN

## 2016-04-28 MED ORDER — TAMSULOSIN HCL 0.4 MG PO CAPS: 0.4000 mg | ORAL_CAPSULE | Freq: Every day | ORAL | 0 refills | Status: DC

## 2016-04-28 NOTE — ED Triage Notes (Signed)
Pt c/o right flank pain that began this morning at 1am; pt states he knows the pain is from a kidney stone because he has had them before; pt had 1 episode of emesis; endorses hematuria

## 2016-04-28 NOTE — ED Provider Notes (Signed)
Emergency Department Provider Note   I have reviewed the triage vital signs and the nursing notes.   HISTORY  Chief Complaint Flank Pain   HPI Timothy Cowan is a 45 y.o. male with PMH of kidney stones presents to the emergency department for evaluation of acute onset right sided back pain that feels similar to prior kidney stones. Patient states his last stone was one year prior. He has no history of requiring intervention on his stones by urology. No fevers or chills. Patient had some associated nausea and continued severe pain. He did not take medication prior to ED presentation. No chest pain or difficulty breathing. Pain is intermittently worse but overall constant. No specific exacerbating or alleviating factors.   Past Medical History:  Diagnosis Date  . Allergy   . Kidney stones     There are no active problems to display for this patient.   History reviewed. No pertinent surgical history.  Current Outpatient Rx  . Order #: [id number] Class: Normal  . Order #: [id number] Class: Print  . Order #: [id number] Class: Print  . Order #: [id number] Class: Print  . Order #: [id number] Class: Normal    Allergies Patient has no known allergies.  No family history on file.  Social History Social History  Substance Use Topics  . Smoking status: Current Some Day Smoker    Packs/day: 0.50    Years: 5.00    Types: Cigarettes    Last attempt to quit: 09/25/2015  . Smokeless tobacco: Never Used  . Alcohol use Yes     Comment: rarely    Review of Systems  Constitutional: No fever/chills Eyes: No visual changes. ENT: No sore throat. Cardiovascular: Denies chest pain. Respiratory: Denies shortness of breath. Gastrointestinal: No abdominal pain.  No nausea, no vomiting.  No diarrhea.  No constipation. Genitourinary: Negative for dysuria. Positive right flank pain.  Musculoskeletal: Negative for back pain. Skin: Negative for rash. Neurological: Negative for headaches, focal  weakness or numbness.  10-point ROS otherwise negative.  ____________________________________________   PHYSICAL EXAM:  VITAL SIGNS: ED Triage Vitals  Enc Vitals Group     BP 04/28/16 0553 151/100     Pulse Rate 04/28/16 0553 66     Resp 04/28/16 0553 18     Temp 04/28/16 0553 97.4 F (36.3 C)     Temp Source 04/28/16 0553 Oral     SpO2 04/28/16 0553 99 %     Pain Score 04/28/16 0551 10   Constitutional: Alert and oriented. Appears uncomfortable and writhing in the bed.  Eyes: Conjunctivae are normal.  Head: Atraumatic. Nose: No congestion/rhinnorhea. Mouth/Throat: Mucous membranes are moist.  Oropharynx non-erythematous. Neck: No stridor.   Cardiovascular: Normal rate, regular rhythm. Good peripheral circulation. Grossly normal heart sounds.   Respiratory: Normal respiratory effort.  No retractions. Lungs CTAB. Gastrointestinal: Soft and nontender. No distention.  Musculoskeletal: No lower extremity tenderness nor edema. No gross deformities of extremities. Neurologic:  Normal speech and language. No gross focal neurologic deficits are appreciated.  Skin:  Skin is warm, dry and intact. No rash noted.  ____________________________________________   LABS (all labs ordered are listed, but only abnormal results are displayed)  Labs Reviewed  URINALYSIS, ROUTINE W REFLEX MICROSCOPIC - Abnormal; Notable for the following:       Result Value   Color, Urine RED (*)    APPearance CLOUDY (*)    Glucose, UA 50 (*)    Hgb urine dipstick LARGE (*)    Protein, ur 100 (*)  Bacteria, UA MANY (*)    All other components within normal limits  BASIC METABOLIC PANEL - Abnormal; Notable for the following:    Glucose, Bld 156 (*)    All other components within normal limits  URINE CULTURE  CBC   ____________________________________________  RADIOLOGY  Ct Renal Stone Study  Result Date: 04/28/2016 CLINICAL DATA:  45 year old male with right flank pain and gross hematuria.  History of renal stones. EXAM: CT ABDOMEN AND PELVIS WITHOUT CONTRAST TECHNIQUE: Multidetector CT imaging of the abdomen and pelvis was performed following the standard protocol without IV contrast. COMPARISON:  Abdominal CT dated 01/07/2013 FINDINGS: Evaluation of this exam is limited in the absence of intravenous contrast. Lower chest: The visualized lung bases are clear. No intra-abdominal free air or free fluid Hepatobiliary: No focal liver abnormality is seen. No gallstones, gallbladder wall thickening, or biliary dilatation. Pancreas: Unremarkable. No pancreatic ductal dilatation or surrounding inflammatory changes. Spleen: Normal in size without focal abnormality. Adrenals/Urinary Tract: The adrenal glands appear unremarkable. There is a 4 mm right ureteropelvic junction stone with mild right hydronephrosis. A 4 mm nonobstructing right renal inferior pole stone is also seen. There is no hydronephrosis or nephrolithiasis on the left. A 2 cm right renal upper pole hypodense lesion appears similar to the prior CT and most likely represents a cyst. The left ureter and urinary bladder appear unremarkable. Stomach/Bowel: Multiple small sigmoid diverticula without active inflammatory changes. Radiodense curvilinear structures within the stomach and in the distal small bowel in the left hemipelvis noted. Correlate with history of recent ingestion. There is no bowel obstruction or active inflammation. Normal appendix. Vascular/Lymphatic: Mild aortoiliac atherosclerotic disease. The abdominal aorta and IVC are grossly unremarkable on this noncontrast study. No portal venous gas identified. There is no adenopathy. Reproductive: The prostate and seminal vesicles are grossly unremarkable. Other:  Small fat containing umbilical hernia. Musculoskeletal: No acute or significant osseous findings. IMPRESSION: A 4 mm right UPJ stone with mild right hydronephrosis. A 4 mm nonobstructive right renal inferior pole stone is seen.  Probable right renal cyst. No bowel obstruction or active inflammation.  Normal appendix. Electronically Signed   By: Elgie Collard M.D.   On: 04/28/2016 07:06    ____________________________________________   PROCEDURES  Procedure(s) performed:   Procedures  None ____________________________________________   INITIAL IMPRESSION / ASSESSMENT AND PLAN / ED COURSE  Pertinent labs & imaging results that were available during my care of the patient were reviewed by me and considered in my medical decision making (see chart for details).  Patient resents emergency department for evaluation of right flank pain that feels similar to prior kidney stones. No abnormal vital signs. Patient appears very uncomfortable exam is consistent with nephrolithiasis. Patient is having right-sided pain making vascular etiology of symptoms unlikely. Abdomen is soft and nontender. Plan for CT renal protocol to evaluate stone size and location. We will obtain labs and urinalysis. Plan for pain and nausea control in the interim.  07:16 AM Patient with 4mm stone at the right UPJ with mild right hydro. Normal renal function by labs. Pain is well controlled at this time. UA pending and patient is trying to provide sample. Plan for Urology follow up and expectant mgmt at home. Discussed this plan with the patient who is comfortable. ____________________________________________  FINAL CLINICAL IMPRESSION(S) / ED DIAGNOSES  Final diagnoses:  Kidney stone  Right flank pain  Ureterolithiasis     MEDICATIONS GIVEN DURING THIS VISIT:  Medications  ketorolac (TORADOL) 30 MG/ML injection 30  mg (30 mg Intravenous Given 04/28/16 0618)  ondansetron (ZOFRAN) injection 4 mg (4 mg Intravenous Given 04/28/16 0622)     NEW OUTPATIENT MEDICATIONS STARTED DURING THIS VISIT:  Discharge Medication List as of 04/28/2016 11:14 AM    START taking these medications   Details  HYDROcodone-acetaminophen (NORCO/VICODIN)  5-325 MG tablet Take 2 tablets by mouth every 4 (four) hours as needed., Starting Wed 04/28/2016, Print    sulfamethoxazole-trimethoprim (BACTRIM DS,SEPTRA DS) 800-160 MG tablet Take 1 tablet by mouth 2 (two) times daily., Starting Wed 04/28/2016, Until Sat 05/08/2016, Print    tamsulosin (FLOMAX) 0.4 MG CAPS capsule Take 1 capsule (0.4 mg total) by mouth daily., Starting Wed 04/28/2016, Print         Note:  This document was prepared using Dragon voice recognition software and may include unintentional dictation errors.  Alona BeneJoshua Campbell Agramonte, MD Emergency Medicine   Maia PlanJoshua G Harbor Paster, MD 04/28/16 [phone]

## 2016-04-28 NOTE — Discharge Instructions (Signed)
You have been seen in the Emergency Department (ED) today for pain that we believe based on your workup, is caused by kidney stones.  As we have discussed, please drink plenty of fluids.  Please make a follow up appointment with the physician(s) listed elsewhere in this documentation. ° °You may take pain medication as needed but ONLY as prescribed.  Please also take your prescribed Flomax daily.  We also recommend that you take over-the-counter ibuprofen regularly according to label instructions over the next 5 days.  Take it with meals to minimize stomach discomfort. ° °Please see your doctor as soon as possible as stones may take 1-3 weeks to pass and you may require additional care or medications. ° °Do not drink alcohol, drive or participate in any other potentially dangerous activities while taking opiate pain medication as it may make you sleepy. Do not take this medication with any other sedating medications, either prescription or over-the-counter. If you were prescribed Percocet or Vicodin, do not take these with acetaminophen (Tylenol) as it is already contained within these medications. °  °Take Vicodin as needed for severe pain.  This medication is an opiate (or narcotic) pain medication and can be habit forming.  Use it as little as possible to achieve adequate pain control.  Do not use or use it with extreme caution if you have a history of opiate abuse or dependence.  If you are on a pain contract with your primary care doctor or a pain specialist, be sure to let them know you were prescribed this medication today from the Emergency Department.  This medication is intended for your use only - do not give any to anyone else and keep it in a secure place where nobody else, especially children, have access to it.  It will also cause or worsen constipation, so you may want to consider taking an over-the-counter stool softener while you are taking this medication. ° °Return to the Emergency Department  (ED) or call your doctor if you have any worsening pain, fever, painful urination, are unable to urinate, or develop other symptoms that concern you. ° °

## 2016-04-28 NOTE — Consult Note (Signed)
Urology Consult   Physician requesting consult: Dr. Madilyn Hook  Reason for consult: Right ureteral calculus  History of Present Illness:  Timothy Cowan he is a 45 year old very healthy gentleman with a history of recurrent urolithiasis.  He presents today with right-sided flank pain with radiation to his right abdomen.  His pain was severe causing him to present to the emergency department.  A CT scan demonstrated a 4 mm right UPJ calculus with associated hydronephrosis.  He denies any gross hematuria, fever, or other specific complaints.  He has never required intervention for his prior stones.  He has never had a stone analysis.  Emergency room evaluated him and Dr. Madilyn Hook called considering his urinalysis that demonstrated a question of urinary tract infection despite the lack of symptoms.  He denies a history of voiding or storage urinary symptoms, hematuria, UTIs, STDs, urolithiasis, GU malignancy/trauma/surgery.  Past Medical History:  Diagnosis Date  . Allergy   . Kidney stones     History reviewed. No pertinent surgical history.  Current Hospital Medications:  Home Meds:    Medication List    TAKE these medications   HYDROcodone-acetaminophen 5-325 MG tablet Commonly known as:  NORCO/VICODIN Take 2 tablets by mouth every 4 (four) hours as needed.   sulfamethoxazole-trimethoprim 800-160 MG tablet Commonly known as:  BACTRIM DS,SEPTRA DS Take 1 tablet by mouth 2 (two) times daily.   tamsulosin 0.4 MG Caps capsule Commonly known as:  FLOMAX Take 1 capsule (0.4 mg total) by mouth daily.     ASK your doctor about these medications   gabapentin 100 MG capsule Commonly known as:  NEURONTIN Take 1-3 capsules by mouth at bedtime   varenicline 0.5 MG tablet Commonly known as:  CHANTIX Take 1 tablet (0.5 mg total) by mouth 2 (two) times daily.       Scheduled Meds: Continuous Infusions: PRN Meds:.  Allergies: No Known Allergies  No family history on file.  Social History:   reports that he has been smoking Cigarettes.  He has a 2.50 pack-year smoking history. He has never used smokeless tobacco. He reports that he drinks alcohol. He reports that he does not use drugs.  ROS: A complete review of systems was performed.  All systems are negative except for pertinent findings as noted.  Physical Exam:  Vital signs in last 24 hours: Temp:  [97.4 F (36.3 C)] 97.4 F (36.3 C) (12/13 0553) Pulse Rate:  [66-93] 93 (12/13 1100) Resp:  [17-18] 18 (12/13 1100) BP: (107-151)/(79-100) 107/79 (12/13 1100) SpO2:  [96 %-99 %] 96 % (12/13 1100) Constitutional:  Alert and oriented, No acute distress Cardiovascular: Regular rate and rhythm, No JVD Respiratory: Normal respiratory effort, Lungs clear bilaterally GI: Abdomen is soft, nontender, nondistended, no abdominal masses GU: No CVA tenderness Lymphatic: No lymphadenopathy Neurologic: Grossly intact, no focal deficits Psychiatric: Normal mood and affect  Laboratory Data:   Recent Labs  04/28/16 0559  WBC 8.9  HGB 15.9  HCT 44.4  PLT 314     Recent Labs  04/28/16 0559  NA 139  K 3.8  CL 104  GLUCOSE 156*  BUN 16  CALCIUM 9.4  CREATININE 0.94     Results for orders placed or performed during the hospital encounter of 04/28/16 (from the past 24 hour(s))  Basic metabolic panel     Status: Abnormal   Collection Time: 04/28/16  5:59 AM  Result Value Ref Range   Sodium 139 135 - 145 mmol/L   Potassium 3.8 3.5 - 5.1  mmol/L   Chloride 104 101 - 111 mmol/L   CO2 27 22 - 32 mmol/L   Glucose, Bld 156 (H) 65 - 99 mg/dL   BUN 16 6 - 20 mg/dL   Creatinine, Ser 1.610.94 0.61 - 1.24 mg/dL   Calcium 9.4 8.9 - 09.610.3 mg/dL   GFR calc non Af Amer >60 >60 mL/min   GFR calc Af Amer >60 >60 mL/min   Anion gap 8 5 - 15  CBC     Status: None   Collection Time: 04/28/16  5:59 AM  Result Value Ref Range   WBC 8.9 4.0 - 10.5 K/uL   RBC 4.91 4.22 - 5.81 MIL/uL   Hemoglobin 15.9 13.0 - 17.0 g/dL   HCT 04.544.4 40.939.0 - 81.152.0  %   MCV 90.4 78.0 - 100.0 fL   MCH 32.4 26.0 - 34.0 pg   MCHC 35.8 30.0 - 36.0 g/dL   RDW 91.412.7 78.211.5 - 95.615.5 %   Platelets 314 150 - 400 K/uL  Urinalysis, Routine w reflex microscopic- may I&O cath if menses     Status: Abnormal   Collection Time: 04/28/16  7:14 AM  Result Value Ref Range   Color, Urine RED (A) YELLOW   APPearance CLOUDY (A) CLEAR   Specific Gravity, Urine 1.028 1.005 - 1.030   pH 5.0 5.0 - 8.0   Glucose, UA 50 (A) NEGATIVE mg/dL   Hgb urine dipstick LARGE (A) NEGATIVE   Bilirubin Urine NEGATIVE NEGATIVE   Ketones, ur NEGATIVE NEGATIVE mg/dL   Protein, ur 213100 (A) NEGATIVE mg/dL   Nitrite NEGATIVE NEGATIVE   Leukocytes, UA NEGATIVE NEGATIVE   RBC / HPF TOO NUMEROUS TO COUNT 0 - 5 RBC/hpf   WBC, UA 6-30 0 - 5 WBC/hpf   Bacteria, UA MANY (A) NONE SEEN   Squamous Epithelial / LPF NONE SEEN NONE SEEN   WBC Clumps PRESENT    Mucous PRESENT    Ca Oxalate Crys, UA PRESENT    No results found for this or any previous visit (from the past 240 hour(s)).  Renal Function:  Recent Labs  04/28/16 0559  CREATININE 0.94   CrCl cannot be calculated (Unknown ideal weight.).  Radiologic Imaging: Ct Renal Stone Study  Result Date: 04/28/2016 CLINICAL DATA:  45 year old male with right flank pain and gross hematuria. History of renal stones. EXAM: CT ABDOMEN AND PELVIS WITHOUT CONTRAST TECHNIQUE: Multidetector CT imaging of the abdomen and pelvis was performed following the standard protocol without IV contrast. COMPARISON:  Abdominal CT dated 01/07/2013 FINDINGS: Evaluation of this exam is limited in the absence of intravenous contrast. Lower chest: The visualized lung bases are clear. No intra-abdominal free air or free fluid Hepatobiliary: No focal liver abnormality is seen. No gallstones, gallbladder wall thickening, or biliary dilatation. Pancreas: Unremarkable. No pancreatic ductal dilatation or surrounding inflammatory changes. Spleen: Normal in size without focal  abnormality. Adrenals/Urinary Tract: The adrenal glands appear unremarkable. There is a 4 mm right ureteropelvic junction stone with mild right hydronephrosis. A 4 mm nonobstructing right renal inferior pole stone is also seen. There is no hydronephrosis or nephrolithiasis on the left. A 2 cm right renal upper pole hypodense lesion appears similar to the prior CT and most likely represents a cyst. The left ureter and urinary bladder appear unremarkable. Stomach/Bowel: Multiple small sigmoid diverticula without active inflammatory changes. Radiodense curvilinear structures within the stomach and in the distal small bowel in the left hemipelvis noted. Correlate with history of recent ingestion. There is no  bowel obstruction or active inflammation. Normal appendix. Vascular/Lymphatic: Mild aortoiliac atherosclerotic disease. The abdominal aorta and IVC are grossly unremarkable on this noncontrast study. No portal venous gas identified. There is no adenopathy. Reproductive: The prostate and seminal vesicles are grossly unremarkable. Other:  Small fat containing umbilical hernia. Musculoskeletal: No acute or significant osseous findings. IMPRESSION: A 4 mm right UPJ stone with mild right hydronephrosis. A 4 mm nonobstructive right renal inferior pole stone is seen. Probable right renal cyst. No bowel obstruction or active inflammation.  Normal appendix. Electronically Signed   By: Elgie CollardArash  Radparvar M.D.   On: 04/28/2016 07:06    I independently reviewed the above imaging studies.  Impression/Recommendation 1.  Right ureteral calculus: I had a detailed discussion with the patient regarding his diagnosis of a right ureteral calculus in the setting of an apparent urinary tract infection.  He has no symptoms of systemic infection and we reviewed options.  He understands that the safest and most conservative approach would be to place a ureteral stent and proceed with antibiotic therapy followed by definitive stone  management.  However, considering his lack of signs or symptoms of systemic infection, he understands that there is possibility could have a concomitant lower urinary tract infection and right ureteral stone as an alternative.  Considering the fact that he is completely healthy and seems to be quite reliable, I also gave him an option of proceeding with medical expulsion therapy while placing him on antibiotics.  He understands this approach does risk development of serious infection or sepsis and that he would need to present to the emergency department immediately should he become febrile.  After much thinking about his options, he ultimately elected to proceed with the latter approach.  Will be discharged home on Bactrim.  His urine culture is pending.  He will be scheduled for close follow-up in approximately one week.  Cc: Dr. Nonah Mattesees  Jaci Desanto,LES 04/28/2016, 6:51 PM    Timothy Cowan, Jr. MD

## 2016-04-28 NOTE — ED Notes (Signed)
ED Provider at bedside. 

## 2016-04-28 NOTE — ED Notes (Signed)
In CT

## 2016-04-29 ENCOUNTER — Emergency Department (HOSPITAL_COMMUNITY)
Admission: EM | Admit: 2016-04-29 | Discharge: 2016-04-29 | Disposition: A | Discharge status: Home / Self Care | Payer: BLUE CROSS/BLUE SHIELD | Attending: Emergency Medicine | Admitting: Emergency Medicine

## 2016-04-29 DIAGNOSIS — F17210 Nicotine dependence, cigarettes, uncomplicated: Secondary | ICD-10-CM | POA: Diagnosis not present

## 2016-04-29 DIAGNOSIS — N390 Urinary tract infection, site not specified: Secondary | ICD-10-CM

## 2016-04-29 DIAGNOSIS — R109 Unspecified abdominal pain: Secondary | ICD-10-CM | POA: Diagnosis present

## 2016-04-29 DIAGNOSIS — N200 Calculus of kidney: Secondary | ICD-10-CM | POA: Diagnosis not present

## 2016-04-29 DIAGNOSIS — R319 Hematuria, unspecified: Secondary | ICD-10-CM | POA: Diagnosis not present

## 2016-04-29 LAB — COMPREHENSIVE METABOLIC PANEL
ALBUMIN: 4.6 g/dL (ref 3.5–5.0)
ALK PHOS: 80 U/L (ref 38–126)
ALT: 36 U/L (ref 17–63)
ANION GAP: 9 (ref 5–15)
AST: 25 U/L (ref 15–41)
BILIRUBIN TOTAL: 0.5 mg/dL (ref 0.3–1.2)
BUN: 19 mg/dL (ref 6–20)
CALCIUM: 9.2 mg/dL (ref 8.9–10.3)
CO2: 27 mmol/L (ref 22–32)
Chloride: 103 mmol/L (ref 101–111)
Creatinine, Ser: 1.15 mg/dL (ref 0.61–1.24)
GFR calc Af Amer: >60 mL/min (ref >60)
GLUCOSE: 136 mg/dL — AB (ref 65–99)
Potassium: 3.6 mmol/L (ref 3.5–5.1)
Sodium: 139 mmol/L (ref 135–145)
TOTAL PROTEIN: 7.9 g/dL (ref 6.5–8.1)

## 2016-04-29 LAB — CBC
HCT: 43.3 % (ref 39.0–52.0)
HEMOGLOBIN: 15.3 g/dL (ref 13.0–17.0)
MCH: 32.1 pg (ref 26.0–34.0)
MCHC: 35.3 g/dL (ref 30.0–36.0)
MCV: 90.8 fL (ref 78.0–100.0)
Platelets: 298 10*3/uL (ref 150–400)
RBC: 4.77 MIL/uL (ref 4.22–5.81)
RDW: 12.8 % (ref 11.5–15.5)
WBC: 11.3 10*3/uL — ABNORMAL HIGH (ref 4.0–10.5)

## 2016-04-29 LAB — URINALYSIS, ROUTINE W REFLEX MICROSCOPIC
BILIRUBIN URINE: NEGATIVE
GLUCOSE, UA: NEGATIVE mg/dL
KETONES UR: NEGATIVE mg/dL
Leukocytes, UA: NEGATIVE
NITRITE: NEGATIVE
PH: 5 (ref 5.0–8.0)
Protein, ur: 30 mg/dL — AB
Specific Gravity, Urine: 1.025 (ref 1.005–1.030)

## 2016-04-29 LAB — URINE CULTURE

## 2016-04-29 LAB — LIPASE, BLOOD: Lipase: 25 U/L (ref 11–51)

## 2016-04-29 MED ORDER — SODIUM CHLORIDE 0.9 % IV BOLUS (SEPSIS): 1000.0000 mL | Freq: Once | INTRAVENOUS | Status: DC

## 2016-04-29 MED ORDER — ONDANSETRON 4 MG PO TBDP
4.0000 mg | ORAL_TABLET | Freq: Once | ORAL | Status: AC | PRN
  Administered 2016-04-29: 4 mg via ORAL
  Filled 2016-04-29: qty 1

## 2016-04-29 MED ORDER — ONDANSETRON 4 MG PO TBDP: 4.0000 mg | ORAL_TABLET | Freq: Three times a day (TID) | ORAL | 0 refills | Status: DC | PRN

## 2016-04-29 NOTE — ED Provider Notes (Signed)
[facility]-EMERGENCY DEPT Provider Note   CSN: [id number] Arrival date & time: 04/29/16  0113     History   Chief Complaint Chief Complaint  Patient presents with  . Flank Pain    HPI Timothy Cowan is a 45 y.o. male.  HPI  Seen yesterday, diagnosed with kidney stone in AM with possible UTI, discharged with abx, pain meds Emesis last night, threw up once, came to ED around 1AM Pain was kidney stone pain Pain off and on while in waiting room And now pain gone, around 4AM, has not had pain since then abx taking bactrim vicodin taking every 4hr, might be reason for emesis No fevers, no dysuria  Past Medical History:  Diagnosis Date  . Allergy   . Kidney stones     There are no active problems to display for this patient.   No past surgical history on file.     Home Medications    Prior to Admission medications   Medication Sig Start Date End Date Taking? Authorizing Provider  gabapentin (NEURONTIN) 100 MG capsule Take 1-3 capsules by mouth at bedtime 02/07/16  Yes Elvina Sidle, MD  HYDROcodone-acetaminophen (NORCO/VICODIN) 5-325 MG tablet Take 2 tablets by mouth every 4 (four) hours as needed. Patient taking differently: Take 2 tablets by mouth every 4 (four) hours as needed for moderate pain.  04/28/16  Yes Maia Plan, MD  sulfamethoxazole-trimethoprim (BACTRIM DS,SEPTRA DS) 800-160 MG tablet Take 1 tablet by mouth 2 (two) times daily. 04/28/16 05/08/16 Yes Tilden Fossa, MD  tamsulosin (FLOMAX) 0.4 MG CAPS capsule Take 1 capsule (0.4 mg total) by mouth daily. 04/28/16  Yes Maia Plan, MD  ondansetron (ZOFRAN ODT) 4 MG disintegrating tablet Take 1 tablet (4 mg total) by mouth every 8 (eight) hours as needed for nausea or vomiting. 04/29/16   Alvira Monday, MD  varenicline (CHANTIX) 0.5 MG tablet Take 1 tablet (0.5 mg total) by mouth 2 (two) times daily. Patient not taking: Reported on 04/28/2016 02/07/16   Elvina Sidle, MD    Family History No family history  on file.  Social History Social History  Substance Use Topics  . Smoking status: Current Some Day Smoker    Packs/day: 0.50    Years: 5.00    Types: Cigarettes    Last attempt to quit: 09/25/2015  . Smokeless tobacco: Never Used  . Alcohol use Yes     Comment: rarely     Allergies   Patient has no known allergies.   Review of Systems Review of Systems  Constitutional: Negative for fever.  HENT: Negative for sore throat.   Eyes: Negative for visual disturbance.  Respiratory: Negative for shortness of breath.   Cardiovascular: Negative for chest pain.  Gastrointestinal: Positive for nausea and vomiting. Negative for abdominal pain.  Genitourinary: Positive for flank pain (r). Negative for difficulty urinating.  Musculoskeletal: Negative for back pain and neck stiffness.  Skin: Negative for rash.  Neurological: Negative for syncope and headaches.     Physical Exam Updated Vital Signs BP 110/75   Pulse 97   Temp 97.9 F (36.6 C) (Oral)   Resp 16   SpO2 97%   Physical Exam  Constitutional: He is oriented to person, place, and time. He appears well-developed and well-nourished. No distress.  HENT:  Head: Normocephalic and atraumatic.  Eyes: Conjunctivae and EOM are normal.  Neck: Normal range of motion.  Cardiovascular: Normal rate, regular rhythm, normal heart sounds and intact distal pulses.  Exam reveals no gallop and  no friction rub.   No murmur heard. Pulmonary/Chest: Effort normal and breath sounds normal. No respiratory distress. He has no wheezes. He has no rales.  Abdominal: Soft. He exhibits no distension. There is no tenderness. There is no guarding and no CVA tenderness.  Musculoskeletal: He exhibits no edema.  Neurological: He is alert and oriented to person, place, and time.  Skin: Skin is warm and dry. He is not diaphoretic.  Nursing note and vitals reviewed.    ED Treatments / Results  Labs (all labs ordered are listed, but only abnormal results  are displayed) Labs Reviewed  COMPREHENSIVE METABOLIC PANEL - Abnormal; Notable for the following:       Result Value   Glucose, Bld 136 (*)    All other components within normal limits  CBC - Abnormal; Notable for the following:    WBC 11.3 (*)    All other components within normal limits  URINALYSIS, ROUTINE W REFLEX MICROSCOPIC - Abnormal; Notable for the following:    APPearance HAZY (*)    Hgb urine dipstick LARGE (*)    Protein, ur 30 (*)    Bacteria, UA RARE (*)    Squamous Epithelial / LPF 0-5 (*)    All other components within normal limits  LIPASE, BLOOD    EKG  EKG Interpretation None       Radiology Ct Renal Stone Study  Result Date: 04/28/2016 CLINICAL DATA:  45 year old male with right flank pain and gross hematuria. History of renal stones. EXAM: CT ABDOMEN AND PELVIS WITHOUT CONTRAST TECHNIQUE: Multidetector CT imaging of the abdomen and pelvis was performed following the standard protocol without IV contrast. COMPARISON:  Abdominal CT dated 01/07/2013 FINDINGS: Evaluation of this exam is limited in the absence of intravenous contrast. Lower chest: The visualized lung bases are clear. No intra-abdominal free air or free fluid Hepatobiliary: No focal liver abnormality is seen. No gallstones, gallbladder wall thickening, or biliary dilatation. Pancreas: Unremarkable. No pancreatic ductal dilatation or surrounding inflammatory changes. Spleen: Normal in size without focal abnormality. Adrenals/Urinary Tract: The adrenal glands appear unremarkable. There is a 4 mm right ureteropelvic junction stone with mild right hydronephrosis. A 4 mm nonobstructing right renal inferior pole stone is also seen. There is no hydronephrosis or nephrolithiasis on the left. A 2 cm right renal upper pole hypodense lesion appears similar to the prior CT and most likely represents a cyst. The left ureter and urinary bladder appear unremarkable. Stomach/Bowel: Multiple small sigmoid diverticula  without active inflammatory changes. Radiodense curvilinear structures within the stomach and in the distal small bowel in the left hemipelvis noted. Correlate with history of recent ingestion. There is no bowel obstruction or active inflammation. Normal appendix. Vascular/Lymphatic: Mild aortoiliac atherosclerotic disease. The abdominal aorta and IVC are grossly unremarkable on this noncontrast study. No portal venous gas identified. There is no adenopathy. Reproductive: The prostate and seminal vesicles are grossly unremarkable. Other:  Small fat containing umbilical hernia. Musculoskeletal: No acute or significant osseous findings. IMPRESSION: A 4 mm right UPJ stone with mild right hydronephrosis. A 4 mm nonobstructive right renal inferior pole stone is seen. Probable right renal cyst. No bowel obstruction or active inflammation.  Normal appendix. Electronically Signed   By: Elgie CollardArash  Radparvar M.D.   On: 04/28/2016 07:06    Procedures Procedures (including critical care time)  Medications Ordered in ED Medications  ondansetron (ZOFRAN-ODT) disintegrating tablet 4 mg (4 mg Oral Given 04/29/16 0150)     Initial Impression / Assessment and Plan /  ED Course  I have reviewed the triage vital signs and the nursing notes.  Pertinent labs & imaging results that were available during my care of the patient were reviewed by me and considered in my medical decision making (see chart for details).  Clinical Course    45yo male diagnosed with nephrolithiasis and UTI yesterday presents with concern for emesis. Pt initially with emesis and pain in waiting room, however at this time has been pain free over the last 3 hours, no additional emesis.  Urinalysis improved however still concerning for UTI.  Labs otherwise unchanged.  Gave rx for zofran in case n/v secondary to abx.  Given pt now pain free, do not feel acute urology consultation is indicated at this time. Recommend continuing bactrim for possible  infection, and returning for fever, worsening pain. Patient discharged in stable condition with understanding of reasons to return.   Final Clinical Impressions(s) / ED Diagnoses   Final diagnoses:  Kidney stone  Urinary tract infection with hematuria, site unspecified    New Prescriptions Discharge Medication List as of 04/29/2016  7:48 AM    START taking these medications   Details  ondansetron (ZOFRAN ODT) 4 MG disintegrating tablet Take 1 tablet (4 mg total) by mouth every 8 (eight) hours as needed for nausea or vomiting., Starting Thu 04/29/2016, Print         Alvira MondayErin Rey Fors, MD 04/29/16 2156

## 2016-04-29 NOTE — ED Notes (Signed)
Requested urine from patient. Gave a urinal to urinate.  

## 2016-04-29 NOTE — ED Triage Notes (Signed)
Pt states that he left here today after being dx with a kidney stone and a kidney infection and was told to return if he vomited. Pt felt well all day but woke up out of his sleep at 0030 and vomited. Alert and oriented.

## 2016-06-18 ENCOUNTER — Other Ambulatory Visit: Payer: Self-pay | Admitting: Family Medicine

## 2016-06-18 DIAGNOSIS — G2581 Restless legs syndrome: Secondary | ICD-10-CM

## 2016-08-28 ENCOUNTER — Encounter: Payer: Self-pay | Admitting: Family Medicine

## 2016-09-11 ENCOUNTER — Encounter: Payer: Self-pay | Admitting: Family Medicine

## 2016-09-11 ENCOUNTER — Ambulatory Visit (INDEPENDENT_AMBULATORY_CARE_PROVIDER_SITE_OTHER): Payer: BLUE CROSS/BLUE SHIELD | Admitting: Family Medicine

## 2016-09-11 VITALS — BP 113/78 | HR 103 | Resp 14 | Ht 70.0 in | Wt 184.0 lb

## 2016-09-11 DIAGNOSIS — G2581 Restless legs syndrome: Secondary | ICD-10-CM

## 2016-09-11 DIAGNOSIS — H02826 Cysts of left eye, unspecified eyelid: Secondary | ICD-10-CM | POA: Diagnosis not present

## 2016-09-11 DIAGNOSIS — F411 Generalized anxiety disorder: Secondary | ICD-10-CM | POA: Diagnosis not present

## 2016-09-11 MED ORDER — VENLAFAXINE HCL 37.5 MG PO TABS: 37.5000 mg | ORAL_TABLET | Freq: Two times a day (BID) | ORAL | 1 refills | Status: AC

## 2016-09-11 MED ORDER — GABAPENTIN 300 MG PO CAPS: 300.0000 mg | ORAL_CAPSULE | Freq: Three times a day (TID) | ORAL | 3 refills | Status: DC

## 2016-09-11 MED ORDER — GABAPENTIN 300 MG PO CAPS: 300.0000 mg | ORAL_CAPSULE | Freq: Every day | ORAL | 2 refills | Status: DC

## 2016-09-11 NOTE — Patient Instructions (Addendum)
  Start taking the venlaxifine for anxiety.  This is like the medicine you were taking previously.    For now, this will be twice a day.  Once we know we have you on a good dose, we can switch you to a long-acting once a day version.  Take the Gabapentin 300 mg at night for restless leg syndrome.  I will refer you to an eye doctor for the eye cyst.  They will contact you.  If you don't hear anything, call us.    It was good to meet you today   IF you received an x-ray today, you will receive an invoice from [facility]. Please contact [facility] at [phone] with questions or concerns regarding your invoice.   IF you received labwork today, you will receive an invoice from [place]. Please contact [facility] at [phone] with questions or concerns regarding your invoice.   Our billing staff will not be able to assist you with questions regarding bills from these companies.  You will be contacted with the lab results as soon as they are available. The fastest way to get your results is to activate your My Chart account. Instructions are located on the last page of this paperwork. If you have not heard from [place] regarding the results in 2 weeks, please contact this office.

## 2016-09-11 NOTE — Progress Notes (Signed)
Timothy Cowan is a 46 y.o. male who presents to Primary Care at [facility] today for several issues:  1.  RLS:  Vision since his had this for "years." He has been on gabapentin 100 mg 1-3 pills also for years. He is wondering if this is not working as well as it has in the past because he states that it does not work every night as use to. He has no history of anemia.  No movement disorders during the day. No family history movement disorder  2.  Anxiety:  Patient has distant history of anxiety in the past. He was given a once a day pill which helped with this. He does not remove the name. States it has been years since he has had trouble with anxiety.  With work he is starting to have increased anxiety symptoms. He describes feelings of anxiousness and nervousness. Does not have accompanying palpitations or chest pain or dyspnea. Denies any depressive symptoms. No suicidal or homicidal ideation.  3.  Cyst of left eyelid:  Present for a little over a year. He's had this drained in the emergency department previously after it became grossly enlarged. No purulence. He has no drainage from it currently.Marland Kitchen He ha cysts in the corners of both eyelids and he would like to have these removed. No trouble with vision. No trouble with tearing.  ROS as above.    PMH reviewed. Patient is a nonsmoker.   Past Medical History:  Diagnosis Date  . Allergy   . Kidney stones    No past surgical history on file.  Medications reviewed. Current Outpatient Prescriptions  Medication Sig Dispense Refill  . gabapentin (NEURONTIN) 100 MG capsule TAKE 1-3 CAPSULES BY MOUTH AT BEDTIME 90 capsule 0  . varenicline (CHANTIX) 0.5 MG tablet Take 1 tablet (0.5 mg total) by mouth 2 (two) times daily. 60 tablet 5  . HYDROcodone-acetaminophen (NORCO/VICODIN) 5-325 MG tablet Take 2 tablets by mouth every 4 (four) hours as needed. (Patient not taking: Reported on 09/11/2016) 20 tablet 0  . tamsulosin (FLOMAX) 0.4 MG CAPS capsule Take 1  capsule (0.4 mg total) by mouth daily. (Patient not taking: Reported on 09/11/2016) 30 capsule 0   No current facility-administered medications for this visit.      Physical Exam:  BP 113/78   Pulse (!) 103   Resp 14   Ht  (1.778 m)   Wt 184 lb (83.5 kg)   BMI 26.40 kg/m  Gen:  Alert, cooperative patient who appears stated age in no acute distress.  Vital signs reviewed. HEENT: EOMI,  Small 4 mm cyst lateral aspect of Right eye. With even smaller cyst noted middle of upper eyelid. Also 0.8 cm cyst lateral aspect of Left eye, with some swelling here.  Fluctuant.  No redness or signs of infection.  All cysts are nontender to palpation. Eyes themselves are not red/injected.  Vision good.  MMM Pulm:  Clear to auscultation bilaterally with good air movement.  No wheezes or rales noted.   Cardiac:  Regular rate and rhythm without murmur auscultated.  Good S1/S2. Neuro:  Strength and sensation 5/5 BL LE's.  Alert and oriented.  No spontaneous movements.  Walks with normal gait. Exts: Non edematous BL  LE, warm and well perfused.  CV:  Distal pulses +2 BL feet  Assessment and Plan:  1.  Restless leg syndrome: -We were going to increase the gabapentin 300 mg. That way he is not taking so many pills and still  having an increased dose. -He can take this 1-2 pills at nighttime to help with sleep and has restless leg syndrome. -Follow-up in 6 weeks if no improvement. Follow up sooner if worsening.  #2. Anxiety: - Work-related.  Starting patient on venlafaxine. -Sounds like he was on similar medicine the past. I looked through his medical records but cannot find any antianxiety or antidepressant medications.  - He denies any depressive symptoms currently.  Will assess for improvement on follow-up.  May need stronger dose.   -No red flags.  #3. Eyelid cyst: -Bilateral eyes. -We'll refer to ophthalmology as he would like to have this definitively removed.  They are located in the epicanthal  folds bilaterally and thus referral for optho rather than derm

## 2016-09-16 ENCOUNTER — Telehealth: Payer: Self-pay | Admitting: Family Medicine

## 2016-09-16 MED ORDER — BUPROPION HCL 75 MG PO TABS: ORAL_TABLET | ORAL | 2 refills | Status: AC

## 2016-09-16 NOTE — Telephone Encounter (Signed)
Please advise 

## 2016-09-16 NOTE — Telephone Encounter (Signed)
I will send in Welbutrin instead.  This should not cause drowsiness.  He should stop the venlafaxine.  He can start the Welbutrin the next day.    Thanks so much!  JW

## 2016-09-16 NOTE — Telephone Encounter (Signed)
Contacted pt about referral for opthalmology. Pt responded also requesting that an alternative medication be called in for the venlafaxine. Pt says this medication makes him too drowsy. Best pt callback number is [phone]. Thanks!

## 2016-09-16 NOTE — Telephone Encounter (Signed)
l/m with provider note 

## 2016-11-12 DIAGNOSIS — H02825 Cysts of left lower eyelid: Secondary | ICD-10-CM | POA: Diagnosis not present

## 2016-12-16 ENCOUNTER — Other Ambulatory Visit: Payer: Self-pay | Admitting: Family Medicine

## 2017-06-24 ENCOUNTER — Telehealth: Payer: BLUE CROSS/BLUE SHIELD | Admitting: Family

## 2017-06-24 DIAGNOSIS — J029 Acute pharyngitis, unspecified: Secondary | ICD-10-CM

## 2017-06-24 MED ORDER — PREDNISONE 5 MG PO TABS: 5.0000 mg | ORAL_TABLET | ORAL | 0 refills | Status: AC

## 2017-06-24 MED ORDER — GABAPENTIN 300 MG PO CAPS: 300.0000 mg | ORAL_CAPSULE | Freq: Every day | ORAL | 0 refills | Status: AC

## 2017-06-24 MED ORDER — BENZONATATE 100 MG PO CAPS: 100.0000 mg | ORAL_CAPSULE | Freq: Three times a day (TID) | ORAL | 0 refills | Status: AC | PRN

## 2017-06-24 NOTE — Addendum Note (Signed)
Addended by: Beau FannyWITHROW, Trent Theisen C on: 06/24/2017 04:21 PM   Modules accepted: Orders

## 2017-06-24 NOTE — Progress Notes (Signed)
Thank you for the details you included in the comment boxes. Those details are very helpful in determining the best course of treatment for you and help us to provide the best care.  We are sorry that you are not feeling well.  Here is how we plan to help!  Based on your presentation I believe you most likely have A cough due to a virus.  This is called viral bronchitis and is best treated by rest, plenty of fluids and control of the cough.  You may use Ibuprofen or Tylenol as directed to help your symptoms.     In addition you may use A non-prescription cough medication called Mucinex DM: take 2 tablets every 12 hours. and A prescription cough medication called Tessalon Perles 100mg. You may take 1-2 capsules every 8 hours as needed for your cough.  Sterapred 5 mg dosepak  From your responses in the eVisit questionnaire you describe inflammation in the upper respiratory tract which is causing a significant cough.  This is commonly called Bronchitis and has four common causes:    Allergies  Viral Infections  Acid Reflux  Bacterial Infection Allergies, viruses and acid reflux are treated by controlling symptoms or eliminating the cause. An example might be a cough caused by taking certain blood pressure medications. You stop the cough by changing the medication. Another example might be a cough caused by acid reflux. Controlling the reflux helps control the cough.  USE OF BRONCHODILATOR ("RESCUE") INHALERS: There is a risk from using your bronchodilator too frequently.  The risk is that over-reliance on a medication which only relaxes the muscles surrounding the breathing tubes can reduce the effectiveness of medications prescribed to reduce swelling and congestion of the tubes themselves.  Although you feel brief relief from the bronchodilator inhaler, your asthma may actually be worsening with the tubes becoming more swollen and filled with mucus.  This can delay other crucial treatments, such  as oral steroid medications. If you need to use a bronchodilator inhaler daily, several times per day, you should discuss this with your provider.  There are probably better treatments that could be used to keep your asthma under control.     HOME CARE . Only take medications as instructed by your medical team. . Complete the entire course of an antibiotic. . Drink plenty of fluids and get plenty of rest. . Avoid close contacts especially the very young and the elderly . Cover your mouth if you cough or cough into your sleeve. . Always remember to wash your hands . A steam or ultrasonic humidifier can help congestion.   GET HELP RIGHT AWAY IF: . You develop worsening fever. . You become short of breath . You cough up blood. . Your symptoms persist after you have completed your treatment plan MAKE SURE YOU   Understand these instructions.  Will watch your condition.  Will get help right away if you are not doing well or get worse.  Your e-visit answers were reviewed by a board certified advanced clinical practitioner to complete your personal care plan.  Depending on the condition, your plan could have included both over the counter or prescription medications. If there is a problem please reply  once you have received a response from your provider. Your safety is important to us.  If you have drug allergies check your prescription carefully.    You can use MyChart to ask questions about today's visit, request a non-urgent call back, or ask for a work   or school excuse for 24 hours related to this e-Visit. If it has been greater than 24 hours you will need to follow up with your provider, or enter a new e-Visit to address those concerns. You will get an e-mail in the next two days asking about your experience.  I hope that your e-visit has been valuable and will speed your recovery. Thank you for using e-visits.   

## 2017-07-01 ENCOUNTER — Telehealth: Payer: BLUE CROSS/BLUE SHIELD | Admitting: Family

## 2017-07-01 DIAGNOSIS — J029 Acute pharyngitis, unspecified: Secondary | ICD-10-CM

## 2017-07-01 MED ORDER — BENZONATATE 100 MG PO CAPS: 100.0000 mg | ORAL_CAPSULE | Freq: Three times a day (TID) | ORAL | 0 refills | Status: AC | PRN

## 2017-07-01 MED ORDER — PREDNISONE 10 MG PO TABS: 10.0000 mg | ORAL_TABLET | Freq: Every day | ORAL | 0 refills | Status: AC

## 2017-07-01 MED ORDER — AZITHROMYCIN 250 MG PO TABS: ORAL_TABLET | ORAL | 0 refills | Status: AC

## 2017-07-01 NOTE — Progress Notes (Signed)
Thank you for the details you included in the comment boxes. Those details are very helpful in determining the best course of treatment for you and help us to provide the best care. Another steroid taper and tessalon perles were sent, and with an antibiotic.  We are sorry that you are not feeling well.  Here is how we plan to help!  Based on your presentation I believe you most likely have A cough due to bacteria.  When patients have a fever and a productive cough with a change in color or increased sputum production, we are concerned about bacterial bronchitis.  If left untreated it can progress to pneumonia.  If your symptoms do not improve with your treatment plan it is important that you contact your provider.   I have prescribed Azithromyin 250 mg: two tablets now and then one tablet daily for 4 additonal days    In addition you may use A non-prescription cough medication called Mucinex DM: take 2 tablets every 12 hours. and A prescription cough medication called Tessalon Perles 100mg . You may take 1-2 capsules every 8 hours as needed for your cough.  Sterapred 5 mg dosepak  From your responses in the eVisit questionnaire you describe inflammation in the upper respiratory tract which is causing a significant cough.  This is commonly called Bronchitis and has four common causes:    Allergies  Viral Infections  Acid Reflux  Bacterial Infection Allergies, viruses and acid reflux are treated by controlling symptoms or eliminating the cause. An example might be a cough caused by taking certain blood pressure medications. You stop the cough by changing the medication. Another example might be a cough caused by acid reflux. Controlling the reflux helps control the cough.  USE OF BRONCHODILATOR ("RESCUE") INHALERS: There is a risk from using your bronchodilator too frequently.  The risk is that over-reliance on a medication which only relaxes the muscles surrounding the breathing tubes can reduce  the effectiveness of medications prescribed to reduce swelling and congestion of the tubes themselves.  Although you feel brief relief from the bronchodilator inhaler, your asthma may actually be worsening with the tubes becoming more swollen and filled with mucus.  This can delay other crucial treatments, such as oral steroid medications. If you need to use a bronchodilator inhaler daily, several times per day, you should discuss this with your provider.  There are probably better treatments that could be used to keep your asthma under control.     HOME CARE . Only take medications as instructed by your medical team. . Complete the entire course of an antibiotic. . Drink plenty of fluids and get plenty of rest. . Avoid close contacts especially the very young and the elderly . Cover your mouth if you cough or cough into your sleeve. . Always remember to wash your hands . A steam or ultrasonic humidifier can help congestion.   GET HELP RIGHT AWAY IF: . You develop worsening fever. . You become short of breath . You cough up blood. . Your symptoms persist after you have completed your treatment plan MAKE SURE YOU   Understand these instructions.  Will watch your condition.  Will get help right away if you are not doing well or get worse.  Your e-visit answers were reviewed by a board certified advanced clinical practitioner to complete your personal care plan.  Depending on the condition, your plan could have included both over the counter or prescription medications. If there is a problem please  reply  once you have received a response from your provider. Your safety is important to [place].  If you have drug allergies check your prescription carefully.    You can use MyChart to ask questions about today's visit, request a non-urgent call back, or ask for a work or school excuse for 24 hours related to this e-Visit. If it has been greater than 24 hours you will need to follow up with your  provider, or enter a new e-Visit to address those concerns. You will get an e-mail in the next two days asking about your experience.  I hope that your e-visit has been valuable and will speed your recovery. Thank you for using e-visits.

## 2017-08-25 IMAGING — CT CT RENAL STONE PROTOCOL
2 of 3 series · 15 of 46 positions shown, 17 images · non-contrast
Comparison: Abdominal CT dated 01/07/2013

CLINICAL DATA: 45-year-old male with right flank pain and gross
hematuria. History of renal stones.

EXAM:
CT ABDOMEN AND PELVIS WITHOUT CONTRAST
TECHNIQUE: Multidetector CT imaging of the abdomen and pelvis was performed
following the standard protocol without IV contrast.

[Series 3: lung · axial · 0.71mm/px · z∈[+1391,+1465]mm · 12 of 43 slices shown, 14 images]
[im 3/43  soft-tissue]
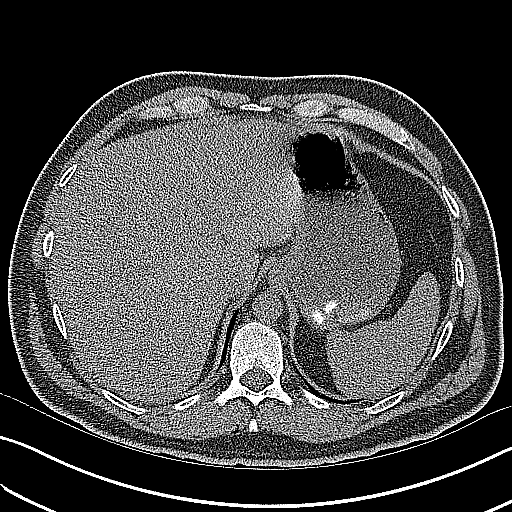
[im 3/43  bone]
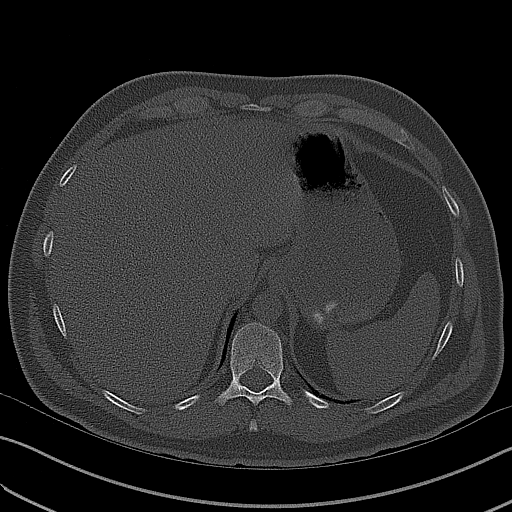
[im 6/43  soft-tissue]
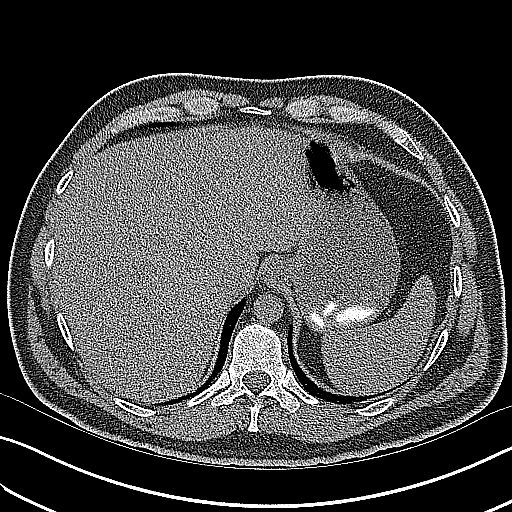
[im 10/43  soft-tissue]
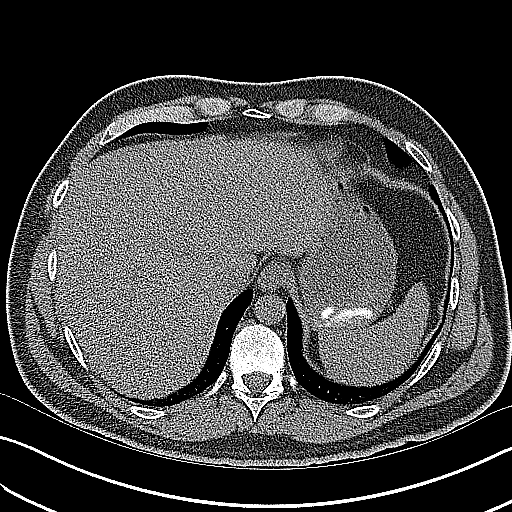
[im 13/43  soft-tissue]
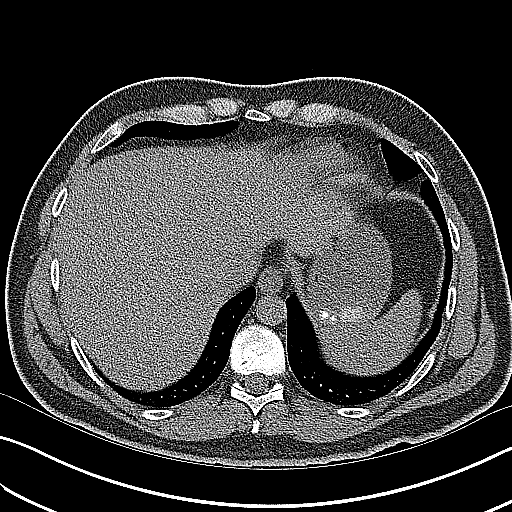
[im 17/43  soft-tissue]
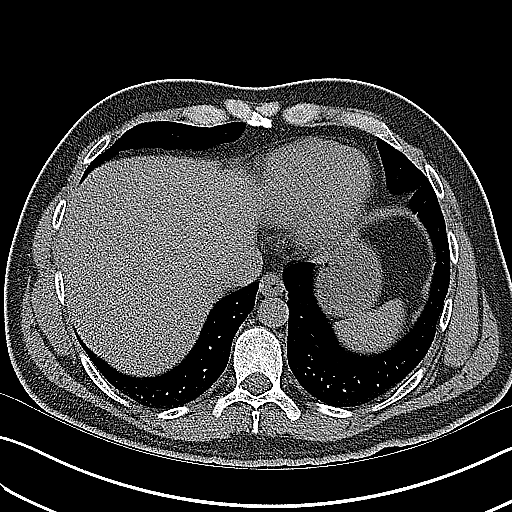
[im 19/43  soft-tissue]
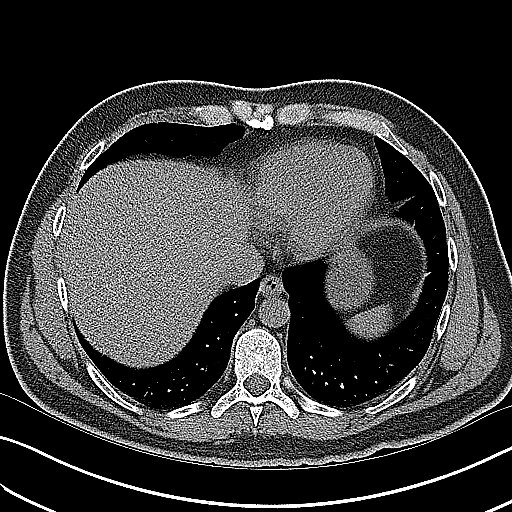
[im 24/43  soft-tissue]
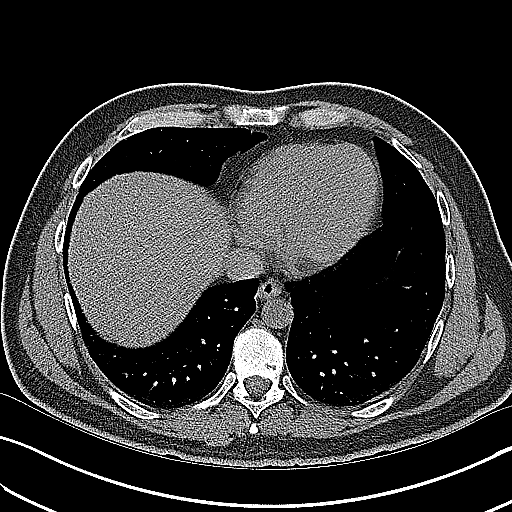
[im 26/43  soft-tissue]
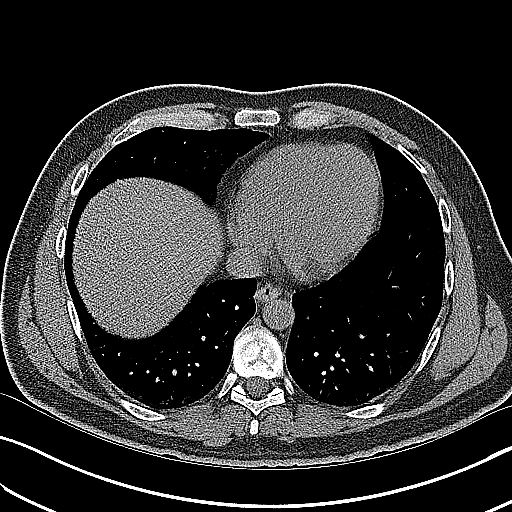
[im 30/43  soft-tissue]
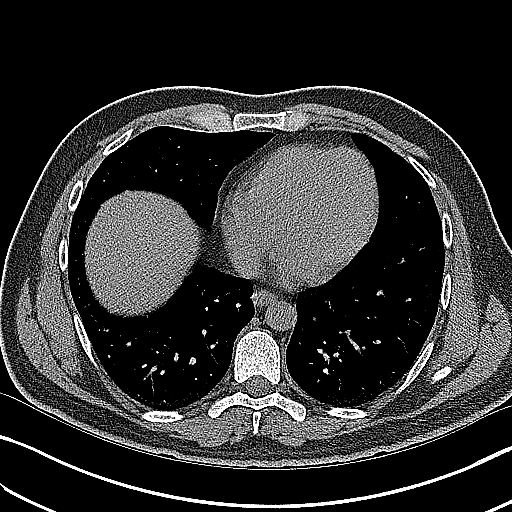
[im 30/43  bone]
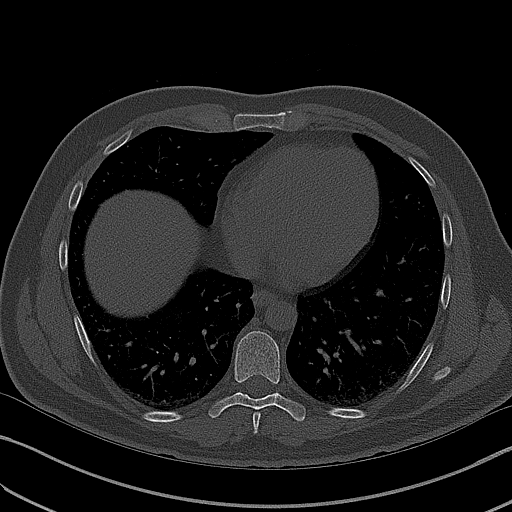
[im 33/43  soft-tissue]
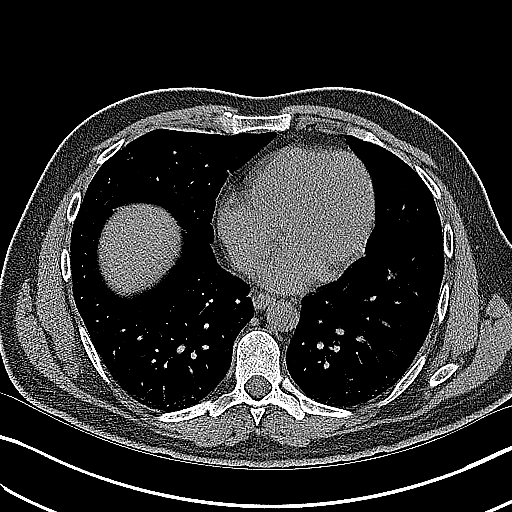
[im 37/43  soft-tissue]
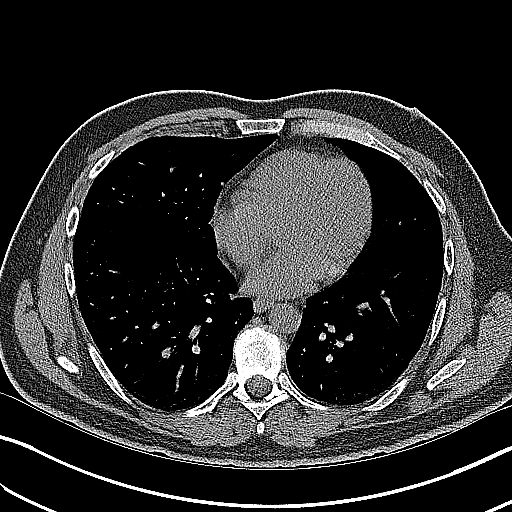
[im 40/43  soft-tissue]
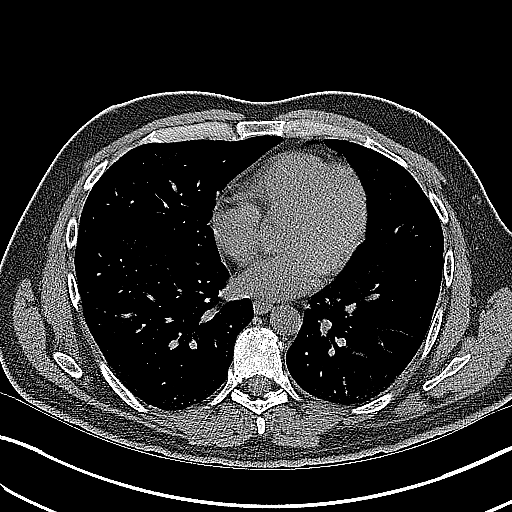

[Series 4: coronal · coronal · 0.66mm/px · 3 of 122 slices shown]
[im 41/122  soft-tissue]
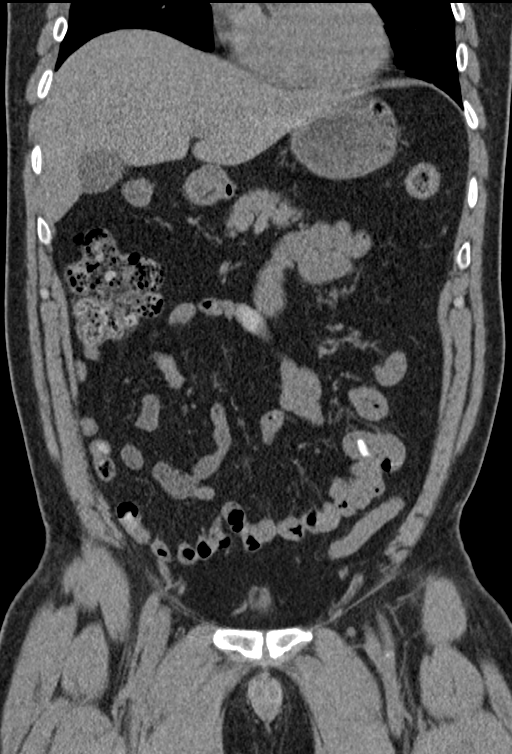
[im 54/122  soft-tissue]
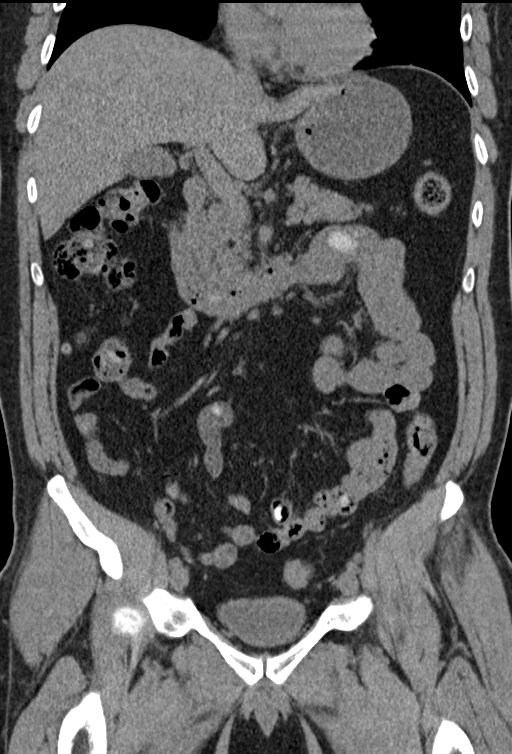
[im 68/122  soft-tissue]
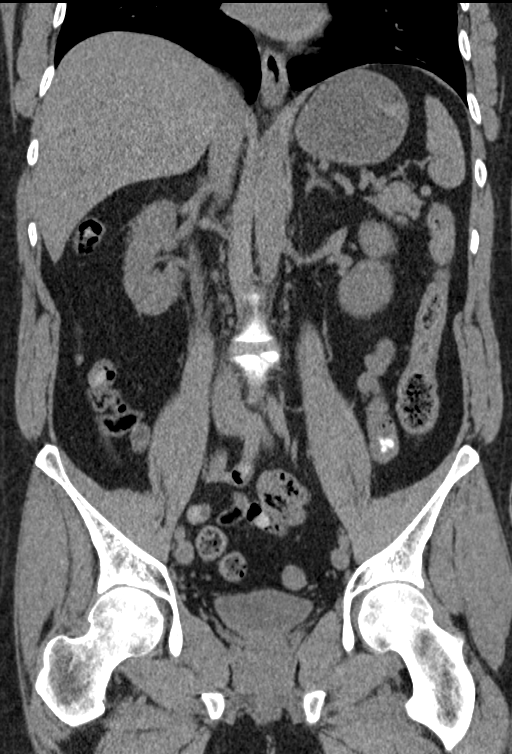

[15 of 46 positions shown; findings below may reference images not displayed]

FINDINGS: Evaluation of this exam is limited in the absence of intravenous
contrast.

Lower chest: The visualized lung bases are clear.

No intra-abdominal free air or free fluid

Hepatobiliary: No focal liver abnormality is seen. No gallstones,
gallbladder wall thickening, or biliary dilatation.

Pancreas: Unremarkable. No pancreatic ductal dilatation or
surrounding inflammatory changes.

Spleen: Normal in size without focal abnormality.

Adrenals/Urinary Tract: The adrenal glands appear unremarkable.
There is a 4 mm right ureteropelvic junction stone with mild right
hydronephrosis. A 4 mm nonobstructing right renal inferior pole
stone is also seen. There is no hydronephrosis or nephrolithiasis on
the left. A 2 cm right renal upper pole hypodense lesion appears
similar to the prior CT and most likely represents a cyst. The left
ureter and urinary bladder appear unremarkable.

Stomach/Bowel: Multiple small sigmoid diverticula without active
inflammatory changes. Radiodense curvilinear structures within the
stomach and in the distal small bowel in the left hemipelvis noted.
Correlate with history of recent ingestion. There is no bowel
obstruction or active inflammation. Normal appendix.

Vascular/Lymphatic: Mild aortoiliac atherosclerotic disease. The
abdominal aorta and IVC are grossly unremarkable on this noncontrast
study. No portal venous gas identified. There is no adenopathy.

Reproductive: The prostate and seminal vesicles are grossly
unremarkable.

Other:  Small fat containing umbilical hernia.

Musculoskeletal: No acute or significant osseous findings.
IMPRESSION: A 4 mm right UPJ stone with mild right hydronephrosis.

A 4 mm nonobstructive right renal inferior pole stone is seen.

Probable right renal cyst.

No bowel obstruction or active inflammation.  Normal appendix.

## 2019-04-30 DIAGNOSIS — J029 Acute pharyngitis, unspecified: Secondary | ICD-10-CM | POA: Diagnosis not present

## 2019-04-30 DIAGNOSIS — R05 Cough: Secondary | ICD-10-CM | POA: Diagnosis not present

## 2019-04-30 DIAGNOSIS — Z20828 Contact with and (suspected) exposure to other viral communicable diseases: Secondary | ICD-10-CM | POA: Diagnosis not present
